# Patient Record
Sex: Male | Born: 1950 | Race: White | Hispanic: No | Marital: Married | Smoking: Never smoker
Health system: Southern US, Community
[De-identification: ages and names within clinical notes are randomized; demographics above are authoritative.]

## PROBLEM LIST (undated history)

## (undated) DIAGNOSIS — I10 Essential (primary) hypertension: Secondary | ICD-10-CM

---

## 2016-01-04 DIAGNOSIS — C4442 Squamous cell carcinoma of skin of scalp and neck: Secondary | ICD-10-CM | POA: Diagnosis not present

## 2016-01-04 DIAGNOSIS — D235 Other benign neoplasm of skin of trunk: Secondary | ICD-10-CM | POA: Diagnosis not present

## 2016-01-04 DIAGNOSIS — L570 Actinic keratosis: Secondary | ICD-10-CM | POA: Diagnosis not present

## 2016-01-04 DIAGNOSIS — L821 Other seborrheic keratosis: Secondary | ICD-10-CM | POA: Diagnosis not present

## 2016-01-04 DIAGNOSIS — D1801 Hemangioma of skin and subcutaneous tissue: Secondary | ICD-10-CM | POA: Diagnosis not present

## 2016-01-04 DIAGNOSIS — D485 Neoplasm of uncertain behavior of skin: Secondary | ICD-10-CM | POA: Diagnosis not present

## 2016-01-04 DIAGNOSIS — L814 Other melanin hyperpigmentation: Secondary | ICD-10-CM | POA: Diagnosis not present

## 2016-01-06 DIAGNOSIS — D044 Carcinoma in situ of skin of scalp and neck: Secondary | ICD-10-CM | POA: Diagnosis not present

## 2016-02-28 DIAGNOSIS — Z85828 Personal history of other malignant neoplasm of skin: Secondary | ICD-10-CM | POA: Diagnosis not present

## 2016-03-08 DIAGNOSIS — Z125 Encounter for screening for malignant neoplasm of prostate: Secondary | ICD-10-CM | POA: Diagnosis not present

## 2016-03-08 DIAGNOSIS — I10 Essential (primary) hypertension: Secondary | ICD-10-CM | POA: Diagnosis not present

## 2016-03-08 DIAGNOSIS — Z23 Encounter for immunization: Secondary | ICD-10-CM | POA: Diagnosis not present

## 2016-03-08 DIAGNOSIS — Z79899 Other long term (current) drug therapy: Secondary | ICD-10-CM | POA: Diagnosis not present

## 2016-03-08 DIAGNOSIS — M109 Gout, unspecified: Secondary | ICD-10-CM | POA: Diagnosis not present

## 2016-03-08 DIAGNOSIS — R351 Nocturia: Secondary | ICD-10-CM | POA: Diagnosis not present

## 2016-03-08 DIAGNOSIS — Z0001 Encounter for general adult medical examination with abnormal findings: Secondary | ICD-10-CM | POA: Diagnosis not present

## 2016-04-06 ENCOUNTER — Other Ambulatory Visit: Payer: Self-pay | Admitting: Gastroenterology

## 2016-05-25 ENCOUNTER — Encounter (HOSPITAL_COMMUNITY): Payer: Self-pay

## 2016-05-29 DIAGNOSIS — Z85828 Personal history of other malignant neoplasm of skin: Secondary | ICD-10-CM | POA: Diagnosis not present

## 2016-05-29 DIAGNOSIS — L905 Scar conditions and fibrosis of skin: Secondary | ICD-10-CM | POA: Diagnosis not present

## 2016-05-30 ENCOUNTER — Ambulatory Visit (HOSPITAL_COMMUNITY)
Admission: RE | Admit: 2016-05-30 | Discharge: 2016-05-30 | Disposition: A | Discharge status: Home / Self Care | Payer: Medicare HMO | Source: Ambulatory Visit | Attending: Gastroenterology | Admitting: Gastroenterology

## 2016-05-30 ENCOUNTER — Encounter (HOSPITAL_COMMUNITY)
Admission: RE | Disposition: A | Discharge status: Home / Self Care | Payer: Self-pay | Source: Ambulatory Visit | Attending: Gastroenterology

## 2016-05-30 ENCOUNTER — Ambulatory Visit (HOSPITAL_COMMUNITY): Payer: Medicare HMO | Admitting: Anesthesiology

## 2016-05-30 ENCOUNTER — Encounter (HOSPITAL_COMMUNITY): Payer: Self-pay | Admitting: *Deleted

## 2016-05-30 DIAGNOSIS — Z79899 Other long term (current) drug therapy: Secondary | ICD-10-CM | POA: Insufficient documentation

## 2016-05-30 DIAGNOSIS — K5730 Diverticulosis of large intestine without perforation or abscess without bleeding: Secondary | ICD-10-CM | POA: Diagnosis not present

## 2016-05-30 DIAGNOSIS — I10 Essential (primary) hypertension: Secondary | ICD-10-CM | POA: Insufficient documentation

## 2016-05-30 DIAGNOSIS — Z1211 Encounter for screening for malignant neoplasm of colon: Secondary | ICD-10-CM | POA: Insufficient documentation

## 2016-05-30 HISTORY — PX: COLONOSCOPY WITH PROPOFOL: SHX5780

## 2016-05-30 HISTORY — DX: Essential (primary) hypertension: I10

## 2016-05-30 SURGERY — COLONOSCOPY WITH PROPOFOL
Anesthesia: Monitor Anesthesia Care

## 2016-05-30 MED ORDER — PROPOFOL 10 MG/ML IV BOLUS
INTRAVENOUS | Status: AC
  Filled 2016-05-30: qty 20

## 2016-05-30 MED ORDER — LIDOCAINE 2% (20 MG/ML) 5 ML SYRINGE
INTRAMUSCULAR | Status: DC | PRN
  Administered 2016-05-30: 100 mg via INTRAVENOUS

## 2016-05-30 MED ORDER — SODIUM CHLORIDE 0.9 % IV SOLN: INTRAVENOUS | Status: DC

## 2016-05-30 MED ORDER — LACTATED RINGERS IV SOLN
INTRAVENOUS | Status: DC
  Administered 2016-05-30: 1000 mL via INTRAVENOUS

## 2016-05-30 MED ORDER — PROPOFOL 10 MG/ML IV BOLUS
INTRAVENOUS | Status: DC | PRN
  Administered 2016-05-30: 40 mg via INTRAVENOUS
  Administered 2016-05-30: 20 mg via INTRAVENOUS

## 2016-05-30 MED ORDER — LIDOCAINE 2% (20 MG/ML) 5 ML SYRINGE
INTRAMUSCULAR | Status: AC
  Filled 2016-05-30: qty 5

## 2016-05-30 MED ORDER — PROPOFOL 500 MG/50ML IV EMUL
INTRAVENOUS | Status: DC | PRN
  Administered 2016-05-30: 130 ug/kg/min via INTRAVENOUS

## 2016-05-30 MED ORDER — PROPOFOL 10 MG/ML IV BOLUS
INTRAVENOUS | Status: AC
Start: 2016-05-30 — End: 2016-05-30
  Filled 2016-05-30: qty 40

## 2016-05-30 SURGICAL SUPPLY — 21 items

## 2016-05-30 NOTE — Anesthesia Preprocedure Evaluation (Addendum)
Anesthesia Evaluation  Patient identified by MRN, date of birth, ID band Patient awake    Reviewed: Allergy & Precautions, H&P , NPO status , Patient's Chart, lab work & pertinent test results  Airway Mallampati: II   Neck ROM: full    Dental   Pulmonary neg pulmonary ROS,    breath sounds clear to auscultation       Cardiovascular hypertension,  Rhythm:regular Rate:Normal     Neuro/Psych    GI/Hepatic   Endo/Other    Renal/GU      Musculoskeletal   Abdominal   Peds  Hematology   Anesthesia Other Findings   Reproductive/Obstetrics                             Anesthesia Physical Anesthesia Plan  ASA: II  Anesthesia Plan: MAC   Post-op Pain Management:    Induction: Intravenous  Airway Management Planned: Simple Face Mask  Additional Equipment:   Intra-op Plan:   Post-operative Plan:   Informed Consent: I have reviewed the patients History and Physical, chart, labs and discussed the procedure including the risks, benefits and alternatives for the proposed anesthesia with the patient or authorized representative who has indicated his/her understanding and acceptance.     Plan Discussed with: CRNA, Anesthesiologist and Surgeon  Anesthesia Plan Comments:         Anesthesia Quick Evaluation

## 2016-05-30 NOTE — Transfer of Care (Signed)
Immediate Anesthesia Transfer of Care Note  Patient: Daniel Miranda  Procedure(s) Performed: Procedure(s): COLONOSCOPY WITH PROPOFOL (N/A)  Patient Location: PACU and Endoscopy Unit  Anesthesia Type:MAC  Level of Consciousness: awake, alert , oriented and patient cooperative  Airway & Oxygen Therapy: Patient Spontanous Breathing and Patient connected to face mask oxygen  Post-op Assessment: Report given to RN, Post -op Vital signs reviewed and stable and Patient moving all extremities  Post vital signs: Reviewed and stable  Last Vitals:  Vitals:   05/30/16 1031  BP: (!) 155/94  Pulse: 70  Resp: (!) 22  Temp: 37.2 C    Last Pain:  Vitals:   05/30/16 1031  TempSrc: Oral         Complications: No apparent anesthesia complications

## 2016-05-30 NOTE — Discharge Instructions (Signed)

## 2016-05-30 NOTE — H&P (Signed)
Procedure: Screening colonoscopy  History: The patient is a 66 year old male born November 29, 1950. He is scheduled to undergo a screening colonoscopy today.  Past medical history: Hypertension. Gout. Rheumatic heart disease. Tonsillectomy.  Exam: The patient is alert and lying comfortably on the endoscopy stretcher. Abdomen is soft and nontender to palpation. Lungs are clear to auscultation. Cardiac exam reveals a regular rhythm.  Plan: Proceed with screening colonoscopy

## 2016-05-30 NOTE — Anesthesia Postprocedure Evaluation (Signed)
Anesthesia Post Note  Patient: Daniel Miranda  Procedure(s) Performed: Procedure(s) (LRB): COLONOSCOPY WITH PROPOFOL (N/A)  Patient location during evaluation: PACU Anesthesia Type: MAC Level of consciousness: awake and alert Pain management: pain level controlled Vital Signs Assessment: post-procedure vital signs reviewed and stable Respiratory status: spontaneous breathing, nonlabored ventilation, respiratory function stable and patient connected to nasal cannula oxygen Cardiovascular status: stable and blood pressure returned to baseline Anesthetic complications: no       Last Vitals:  Vitals:   05/30/16 1210 05/30/16 1215  BP: 118/85 (!) 143/79  Pulse: 64 73  Resp: 15 15  Temp:      Last Pain:  Vitals:   05/30/16 1200  TempSrc: Oral                 Ridgely Anastacio S

## 2016-05-30 NOTE — Op Note (Signed)
[facility] Patient Name: Daniel Miranda Procedure Date: 05/30/2016 MRN: [id number] Attending MD: Garlan Fair , MD Date of Birth: 05-May-1951 CSN: [id number] Age: 66 Admit Type: Outpatient Procedure:                Colonoscopy Indications:              Screening for colorectal malignant neoplasm Providers:                Garlan Fair, MD, Laverta Baltimore RN, RN,                            William Dalton, Technician Referring MD:              Medicines:                Propofol per Anesthesia Complications:            No immediate complications. Estimated Blood Loss:     Estimated blood loss: none. Procedure:                Pre-Anesthesia Assessment:                           - Prior to the procedure, a History and Physical                            was performed, and patient medications and                            allergies were reviewed. The patient's tolerance of                            previous anesthesia was also reviewed. The risks                            and benefits of the procedure and the sedation                            options and risks were discussed with the patient.                            All questions were answered, and informed consent                            was obtained. Prior Anticoagulants: The patient has                            taken no previous anticoagulant or antiplatelet                            agents. ASA Grade Assessment: II - A patient with                            mild systemic disease. After reviewing the risks  and benefits, the patient was deemed in                            satisfactory condition to undergo the procedure.                           After obtaining informed consent, the colonoscope                            was passed under direct vision. Throughout the                            procedure, the patient's blood pressure, pulse, and   oxygen saturations were monitored continuously. The                            [id number] scope was introduced through                            the anus and advanced to the the cecum, identified                            by appendiceal orifice and ileocecal valve. The                            colonoscopy was performed without difficulty. The                            patient tolerated the procedure well. The quality                            of the bowel preparation was good. The appendiceal                            orifice and the rectum were photographed. Scope In: 11:35:01 AM Scope Out: 11:53:03 AM Scope Withdrawal Time: 0 hours 5 minutes 42 seconds  Total Procedure Duration: 0 hours 18 minutes 2 seconds  Findings:      The perianal and digital rectal examinations were normal.      The entire examined colon appeared normal. Impression:               - The entire examined colon is normal.                           - No specimens collected. Moderate Sedation:      N/A- Per Anesthesia Care Recommendation:           - Patient has a contact number available for                            emergencies. The signs and symptoms of potential                            delayed complications were discussed with the  patient. Return to normal activities tomorrow.                            Written discharge instructions were provided to the                            patient.                           - Repeat colonoscopy in 10 years for screening                            purposes.                           - Resume previous diet.                           - Continue present medications. Procedure Code(s):        --- Professional ---                           [id number], Colorectal cancer screening; colonoscopy on                            individual not meeting criteria for high risk Diagnosis Code(s):        --- Professional ---                            Z12.11, Encounter for screening for malignant                            neoplasm of colon CPT copyright 2016 [employer]. All rights reserved. The codes documented in this report are preliminary and upon coder review may  be revised to meet current compliance requirements. Earle Gell, MD Garlan Fair, MD 05/30/2016 11:58:11 AM This report has been signed electronically. Number of Addenda: 0

## 2016-06-02 ENCOUNTER — Encounter (HOSPITAL_COMMUNITY): Payer: Self-pay | Admitting: Gastroenterology

## 2016-06-21 DIAGNOSIS — M109 Gout, unspecified: Secondary | ICD-10-CM | POA: Diagnosis not present

## 2016-06-21 DIAGNOSIS — M79671 Pain in right foot: Secondary | ICD-10-CM | POA: Diagnosis not present

## 2016-06-21 DIAGNOSIS — M79674 Pain in right toe(s): Secondary | ICD-10-CM | POA: Diagnosis not present

## 2016-08-15 DIAGNOSIS — M109 Gout, unspecified: Secondary | ICD-10-CM | POA: Diagnosis not present

## 2017-02-04 DIAGNOSIS — I10 Essential (primary) hypertension: Secondary | ICD-10-CM | POA: Diagnosis not present

## 2017-02-04 DIAGNOSIS — X58XXXA Exposure to other specified factors, initial encounter: Secondary | ICD-10-CM | POA: Diagnosis not present

## 2017-02-04 DIAGNOSIS — S39012A Strain of muscle, fascia and tendon of lower back, initial encounter: Secondary | ICD-10-CM | POA: Diagnosis not present

## 2017-02-07 ENCOUNTER — Ambulatory Visit
Admission: RE | Admit: 2017-02-07 | Discharge: 2017-02-07 | Disposition: A | Discharge status: Home / Self Care | Payer: Medicare HMO | Source: Ambulatory Visit | Attending: Internal Medicine | Admitting: Internal Medicine

## 2017-02-07 ENCOUNTER — Other Ambulatory Visit: Payer: Self-pay | Admitting: Internal Medicine

## 2017-02-07 DIAGNOSIS — M5489 Other dorsalgia: Secondary | ICD-10-CM

## 2017-02-07 DIAGNOSIS — M545 Low back pain: Secondary | ICD-10-CM | POA: Diagnosis not present

## 2017-02-07 DIAGNOSIS — M48061 Spinal stenosis, lumbar region without neurogenic claudication: Secondary | ICD-10-CM | POA: Diagnosis not present

## 2017-03-13 DIAGNOSIS — I10 Essential (primary) hypertension: Secondary | ICD-10-CM | POA: Diagnosis not present

## 2017-03-13 DIAGNOSIS — Z125 Encounter for screening for malignant neoplasm of prostate: Secondary | ICD-10-CM | POA: Diagnosis not present

## 2017-03-13 DIAGNOSIS — Z0000 Encounter for general adult medical examination without abnormal findings: Secondary | ICD-10-CM | POA: Diagnosis not present

## 2017-03-13 DIAGNOSIS — M109 Gout, unspecified: Secondary | ICD-10-CM | POA: Diagnosis not present

## 2017-03-13 DIAGNOSIS — M5137 Other intervertebral disc degeneration, lumbosacral region: Secondary | ICD-10-CM | POA: Diagnosis not present

## 2017-03-13 DIAGNOSIS — Z1389 Encounter for screening for other disorder: Secondary | ICD-10-CM | POA: Diagnosis not present

## 2017-03-13 DIAGNOSIS — Z23 Encounter for immunization: Secondary | ICD-10-CM | POA: Diagnosis not present

## 2017-03-13 DIAGNOSIS — Z79899 Other long term (current) drug therapy: Secondary | ICD-10-CM | POA: Diagnosis not present

## 2017-05-24 DIAGNOSIS — R69 Illness, unspecified: Secondary | ICD-10-CM | POA: Diagnosis not present

## 2017-05-28 DIAGNOSIS — L814 Other melanin hyperpigmentation: Secondary | ICD-10-CM | POA: Diagnosis not present

## 2017-05-28 DIAGNOSIS — D229 Melanocytic nevi, unspecified: Secondary | ICD-10-CM | POA: Diagnosis not present

## 2017-05-28 DIAGNOSIS — L821 Other seborrheic keratosis: Secondary | ICD-10-CM | POA: Diagnosis not present

## 2017-05-28 DIAGNOSIS — D1801 Hemangioma of skin and subcutaneous tissue: Secondary | ICD-10-CM | POA: Diagnosis not present

## 2017-06-07 DIAGNOSIS — Z7189 Other specified counseling: Secondary | ICD-10-CM | POA: Diagnosis not present

## 2017-06-07 DIAGNOSIS — Z23 Encounter for immunization: Secondary | ICD-10-CM | POA: Diagnosis not present

## 2017-06-12 DIAGNOSIS — R69 Illness, unspecified: Secondary | ICD-10-CM | POA: Diagnosis not present

## 2017-08-08 DIAGNOSIS — R69 Illness, unspecified: Secondary | ICD-10-CM | POA: Diagnosis not present

## 2017-08-14 DIAGNOSIS — R69 Illness, unspecified: Secondary | ICD-10-CM | POA: Diagnosis not present

## 2017-11-07 DIAGNOSIS — R69 Illness, unspecified: Secondary | ICD-10-CM | POA: Diagnosis not present

## 2018-03-19 DIAGNOSIS — Z23 Encounter for immunization: Secondary | ICD-10-CM | POA: Diagnosis not present

## 2018-03-19 DIAGNOSIS — I10 Essential (primary) hypertension: Secondary | ICD-10-CM | POA: Diagnosis not present

## 2018-03-19 DIAGNOSIS — Z125 Encounter for screening for malignant neoplasm of prostate: Secondary | ICD-10-CM | POA: Diagnosis not present

## 2018-03-19 DIAGNOSIS — Z0000 Encounter for general adult medical examination without abnormal findings: Secondary | ICD-10-CM | POA: Diagnosis not present

## 2018-03-19 DIAGNOSIS — Z79899 Other long term (current) drug therapy: Secondary | ICD-10-CM | POA: Diagnosis not present

## 2018-03-19 DIAGNOSIS — Z1389 Encounter for screening for other disorder: Secondary | ICD-10-CM | POA: Diagnosis not present

## 2018-03-19 DIAGNOSIS — M109 Gout, unspecified: Secondary | ICD-10-CM | POA: Diagnosis not present

## 2018-03-19 DIAGNOSIS — M5137 Other intervertebral disc degeneration, lumbosacral region: Secondary | ICD-10-CM | POA: Diagnosis not present

## 2018-06-27 DIAGNOSIS — L814 Other melanin hyperpigmentation: Secondary | ICD-10-CM | POA: Diagnosis not present

## 2018-06-27 DIAGNOSIS — L570 Actinic keratosis: Secondary | ICD-10-CM | POA: Diagnosis not present

## 2018-06-27 DIAGNOSIS — D229 Melanocytic nevi, unspecified: Secondary | ICD-10-CM | POA: Diagnosis not present

## 2018-06-27 DIAGNOSIS — L819 Disorder of pigmentation, unspecified: Secondary | ICD-10-CM | POA: Diagnosis not present

## 2018-06-27 DIAGNOSIS — D1801 Hemangioma of skin and subcutaneous tissue: Secondary | ICD-10-CM | POA: Diagnosis not present

## 2018-06-27 DIAGNOSIS — L821 Other seborrheic keratosis: Secondary | ICD-10-CM | POA: Diagnosis not present

## 2018-07-10 DIAGNOSIS — Z0100 Encounter for examination of eyes and vision without abnormal findings: Secondary | ICD-10-CM | POA: Diagnosis not present

## 2018-07-10 DIAGNOSIS — H524 Presbyopia: Secondary | ICD-10-CM | POA: Diagnosis not present

## 2019-01-01 DIAGNOSIS — K648 Other hemorrhoids: Secondary | ICD-10-CM | POA: Diagnosis not present

## 2019-01-01 DIAGNOSIS — K625 Hemorrhage of anus and rectum: Secondary | ICD-10-CM | POA: Diagnosis not present

## 2019-01-30 DIAGNOSIS — D229 Melanocytic nevi, unspecified: Secondary | ICD-10-CM | POA: Diagnosis not present

## 2019-01-30 DIAGNOSIS — L814 Other melanin hyperpigmentation: Secondary | ICD-10-CM | POA: Diagnosis not present

## 2019-01-30 DIAGNOSIS — L570 Actinic keratosis: Secondary | ICD-10-CM | POA: Diagnosis not present

## 2019-01-30 DIAGNOSIS — L578 Other skin changes due to chronic exposure to nonionizing radiation: Secondary | ICD-10-CM | POA: Diagnosis not present

## 2019-01-30 DIAGNOSIS — L819 Disorder of pigmentation, unspecified: Secondary | ICD-10-CM | POA: Diagnosis not present

## 2019-01-30 DIAGNOSIS — L821 Other seborrheic keratosis: Secondary | ICD-10-CM | POA: Diagnosis not present

## 2019-02-19 DIAGNOSIS — R69 Illness, unspecified: Secondary | ICD-10-CM | POA: Diagnosis not present

## 2019-03-03 DIAGNOSIS — L578 Other skin changes due to chronic exposure to nonionizing radiation: Secondary | ICD-10-CM | POA: Diagnosis not present

## 2019-03-03 DIAGNOSIS — L570 Actinic keratosis: Secondary | ICD-10-CM | POA: Diagnosis not present

## 2019-04-07 DIAGNOSIS — Z0000 Encounter for general adult medical examination without abnormal findings: Secondary | ICD-10-CM | POA: Diagnosis not present

## 2019-04-07 DIAGNOSIS — Z1389 Encounter for screening for other disorder: Secondary | ICD-10-CM | POA: Diagnosis not present

## 2019-04-07 DIAGNOSIS — K648 Other hemorrhoids: Secondary | ICD-10-CM | POA: Diagnosis not present

## 2019-04-07 DIAGNOSIS — I10 Essential (primary) hypertension: Secondary | ICD-10-CM | POA: Diagnosis not present

## 2019-04-07 DIAGNOSIS — M5137 Other intervertebral disc degeneration, lumbosacral region: Secondary | ICD-10-CM | POA: Diagnosis not present

## 2019-04-07 DIAGNOSIS — K625 Hemorrhage of anus and rectum: Secondary | ICD-10-CM | POA: Diagnosis not present

## 2019-04-07 DIAGNOSIS — M109 Gout, unspecified: Secondary | ICD-10-CM | POA: Diagnosis not present

## 2019-04-07 DIAGNOSIS — Z79899 Other long term (current) drug therapy: Secondary | ICD-10-CM | POA: Diagnosis not present

## 2019-04-07 DIAGNOSIS — Z8249 Family history of ischemic heart disease and other diseases of the circulatory system: Secondary | ICD-10-CM | POA: Diagnosis not present

## 2019-04-07 DIAGNOSIS — Z125 Encounter for screening for malignant neoplasm of prostate: Secondary | ICD-10-CM | POA: Diagnosis not present

## 2019-05-27 DIAGNOSIS — K641 Second degree hemorrhoids: Secondary | ICD-10-CM | POA: Diagnosis not present

## 2019-06-16 ENCOUNTER — Ambulatory Visit: Payer: Medicare HMO | Attending: Internal Medicine

## 2019-06-16 DIAGNOSIS — Z23 Encounter for immunization: Secondary | ICD-10-CM

## 2019-06-16 NOTE — Progress Notes (Signed)
   Covid-19 Vaccination Clinic  Name:  Daniel Miranda    MRN: [id number] DOB: [birth year]  06/16/2019  [name] was observed post Covid-19 immunization for 15 minutes without incidence. He was provided with Vaccine Information Sheet and instruction to access the V-Safe system.   [name] was instructed to call 911 with any severe reactions post vaccine: Marland Kitchen Difficulty breathing  . Swelling of your face and throat  . A fast heartbeat  . A bad rash all over your body  . Dizziness and weakness    Immunizations Administered    Name Date Dose VIS Date Route   Pfizer COVID-19 Vaccine 06/16/2019  1:45 PM 0.3 mL 05/02/2019 Intramuscular   Manufacturer: [place]   Lot: [id number]   [place]: [id number]

## 2019-07-03 DIAGNOSIS — E785 Hyperlipidemia, unspecified: Secondary | ICD-10-CM | POA: Diagnosis not present

## 2019-07-03 DIAGNOSIS — L578 Other skin changes due to chronic exposure to nonionizing radiation: Secondary | ICD-10-CM | POA: Diagnosis not present

## 2019-07-03 DIAGNOSIS — L718 Other rosacea: Secondary | ICD-10-CM | POA: Diagnosis not present

## 2019-07-07 ENCOUNTER — Ambulatory Visit: Payer: Medicare HMO | Attending: Internal Medicine

## 2019-07-07 DIAGNOSIS — Z23 Encounter for immunization: Secondary | ICD-10-CM | POA: Insufficient documentation

## 2019-07-07 NOTE — Progress Notes (Signed)
   Covid-19 Vaccination Clinic  Name:  Daniel Miranda    MRN: [id number] DOB: [birth year]  07/07/2019  Daniel Miranda was observed post Covid-19 immunization for 15 minutes without incidence. He was provided with Vaccine Information Sheet and instruction to access the V-Safe system.   Daniel Miranda was instructed to call 911 with any severe reactions post vaccine: Marland Kitchen Difficulty breathing  . Swelling of your face and throat  . A fast heartbeat  . A bad rash all over your body  . Dizziness and weakness    Immunizations Administered    Name Date Dose VIS Date Route   Pfizer COVID-19 Vaccine 07/07/2019 11:54 AM 0.3 mL 05/02/2019 Intramuscular   Manufacturer: [place]   Lot: [id number]   [place]: [id number]

## 2019-07-12 IMAGING — CR DG LUMBAR SPINE 2-3V
3 series · 3 of 3 positions shown · non-contrast
Comparison: None.

CLINICAL DATA: 65-year-old male with a history of low back pain/
lumbar back pain

EXAM:
LUMBAR SPINE - 2-3 VIEW

[t l-spine a.p.]
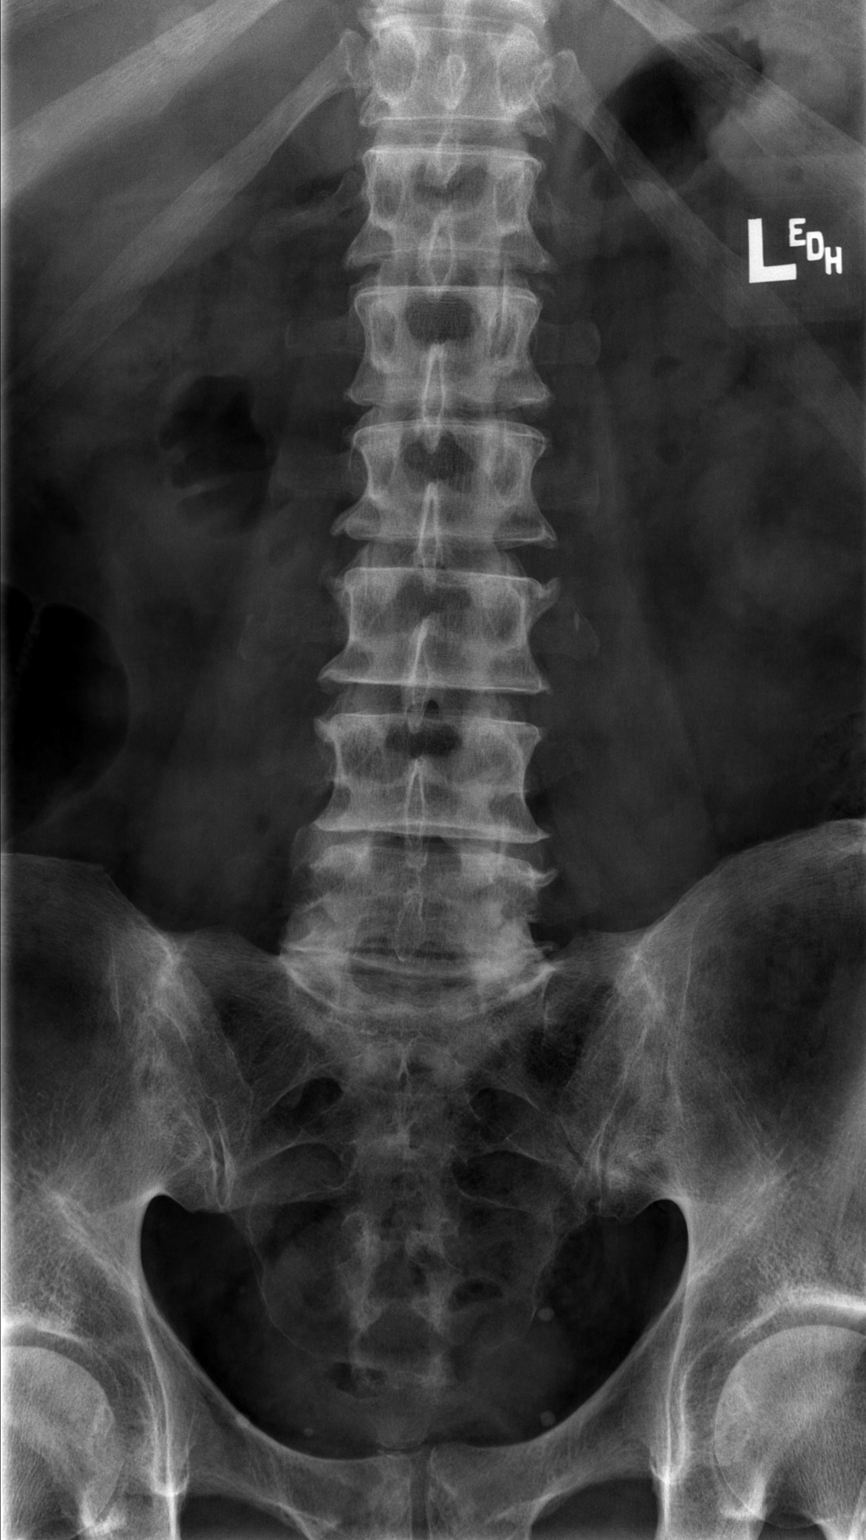

[t l-spine lat]
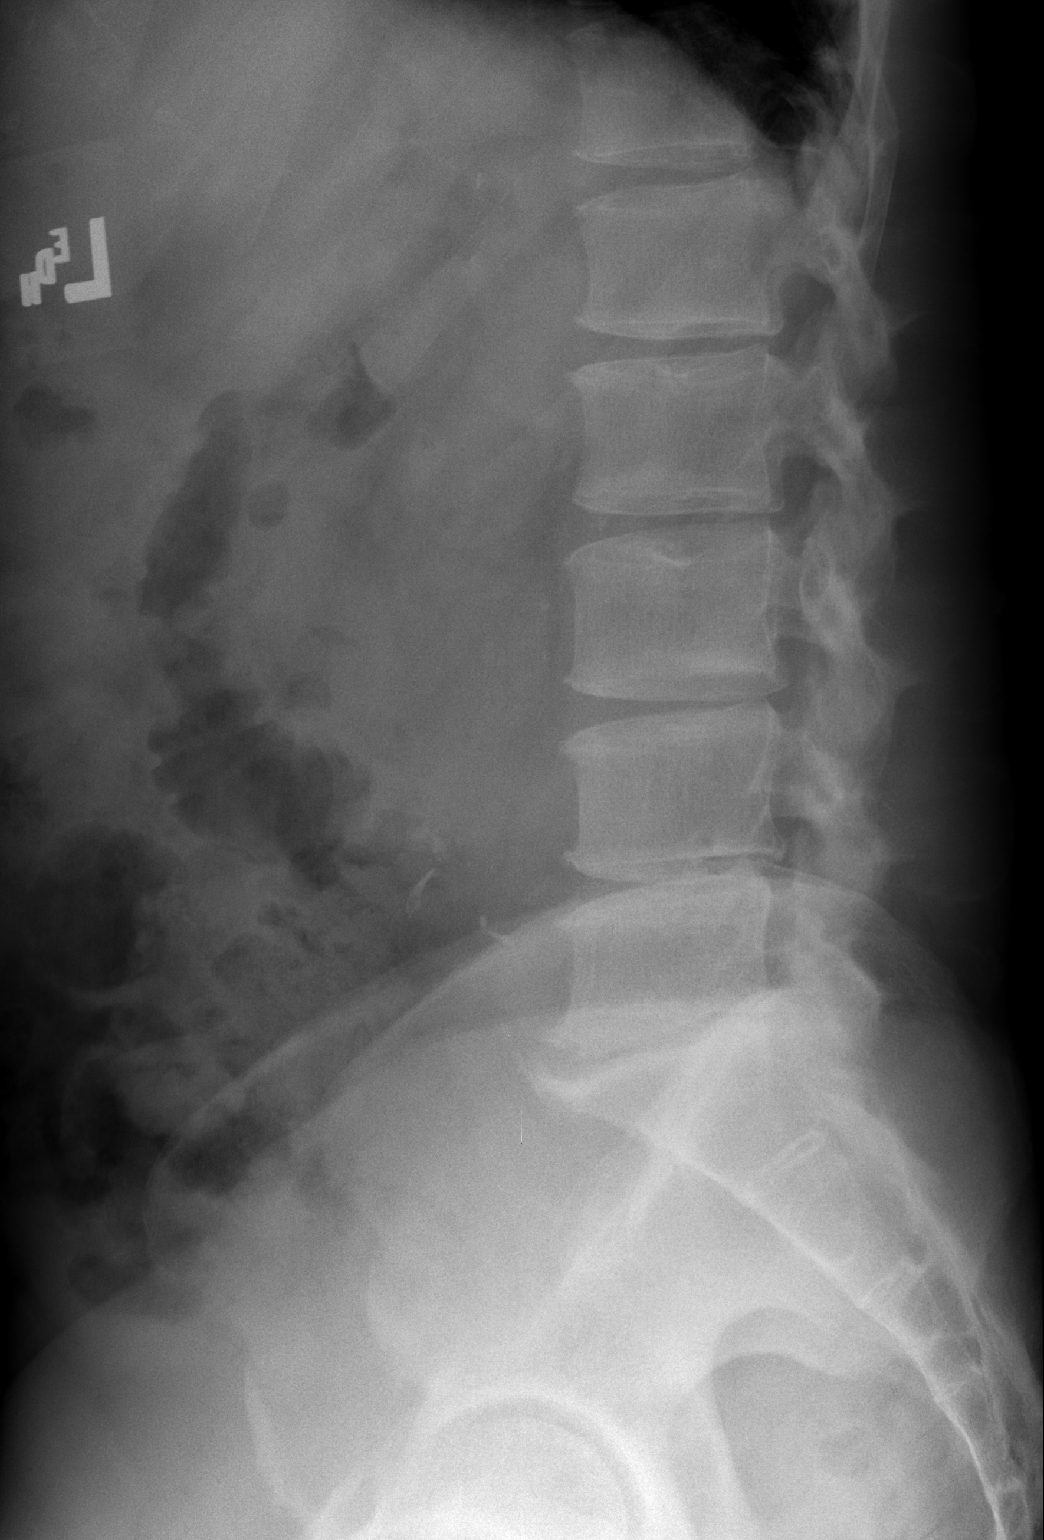

[t l-spine l5-s1 spot]
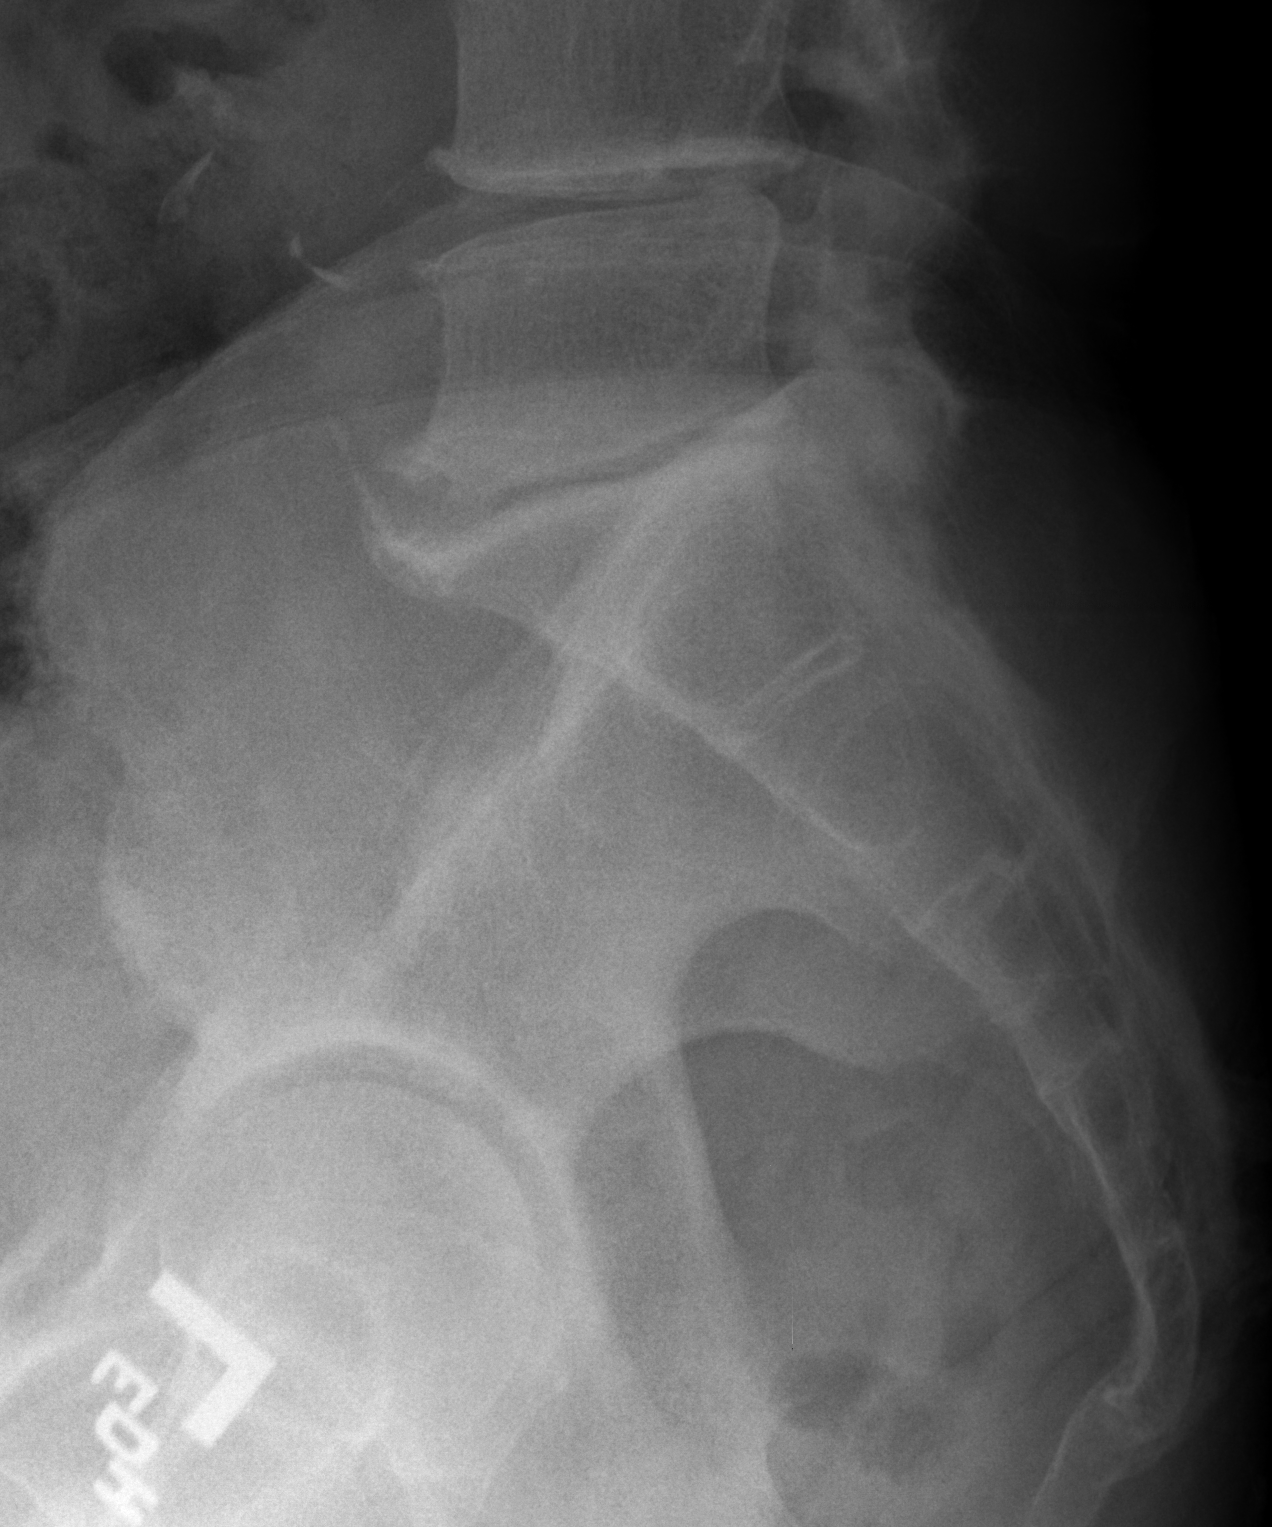

[3 of 3 positions shown; findings below may reference images not displayed]

FINDINGS: Lumbar Spine:

Note that there appear to be vestigial T12 ribs, with the current
numbering system using 5 lumbar vertebral bodies.

Lumbar vertebral elements maintain normal alignment without evidence
of subluxation. No anterolisthesis or retrolisthesis. Straightening
of the normal lumbar lordosis.

No fracture line identified. Lumbar vertebral body heights
maintained.

Advanced endplate sclerosis with disc space narrowing and anterior
osteophyte production at L5-S1.

Facet changes at L4-L5 and L5-S1.

Vascular calcifications
IMPRESSION: No acute fracture or malalignment of the lumbar spine.

Advanced degenerative disc disease at L5-S1.

Mild facet changes at L4-L5 and L5-S1.

## 2019-08-11 DIAGNOSIS — M62838 Other muscle spasm: Secondary | ICD-10-CM | POA: Diagnosis not present

## 2019-08-11 DIAGNOSIS — M26622 Arthralgia of left temporomandibular joint: Secondary | ICD-10-CM | POA: Diagnosis not present

## 2019-08-11 DIAGNOSIS — M26602 Left temporomandibular joint disorder, unspecified: Secondary | ICD-10-CM | POA: Diagnosis not present

## 2019-08-11 DIAGNOSIS — M6281 Muscle weakness (generalized): Secondary | ICD-10-CM | POA: Diagnosis not present

## 2019-08-20 DIAGNOSIS — M26622 Arthralgia of left temporomandibular joint: Secondary | ICD-10-CM | POA: Diagnosis not present

## 2019-08-20 DIAGNOSIS — M26602 Left temporomandibular joint disorder, unspecified: Secondary | ICD-10-CM | POA: Diagnosis not present

## 2019-08-20 DIAGNOSIS — M62838 Other muscle spasm: Secondary | ICD-10-CM | POA: Diagnosis not present

## 2019-08-20 DIAGNOSIS — M6281 Muscle weakness (generalized): Secondary | ICD-10-CM | POA: Diagnosis not present

## 2019-09-03 DIAGNOSIS — M26602 Left temporomandibular joint disorder, unspecified: Secondary | ICD-10-CM | POA: Diagnosis not present

## 2019-09-03 DIAGNOSIS — M6281 Muscle weakness (generalized): Secondary | ICD-10-CM | POA: Diagnosis not present

## 2019-09-03 DIAGNOSIS — M62838 Other muscle spasm: Secondary | ICD-10-CM | POA: Diagnosis not present

## 2019-09-03 DIAGNOSIS — M26622 Arthralgia of left temporomandibular joint: Secondary | ICD-10-CM | POA: Diagnosis not present

## 2019-09-18 DIAGNOSIS — M26602 Left temporomandibular joint disorder, unspecified: Secondary | ICD-10-CM | POA: Diagnosis not present

## 2019-09-18 DIAGNOSIS — M62838 Other muscle spasm: Secondary | ICD-10-CM | POA: Diagnosis not present

## 2019-09-18 DIAGNOSIS — M6281 Muscle weakness (generalized): Secondary | ICD-10-CM | POA: Diagnosis not present

## 2019-09-18 DIAGNOSIS — M26622 Arthralgia of left temporomandibular joint: Secondary | ICD-10-CM | POA: Diagnosis not present

## 2019-10-07 DIAGNOSIS — M26602 Left temporomandibular joint disorder, unspecified: Secondary | ICD-10-CM | POA: Diagnosis not present

## 2019-10-07 DIAGNOSIS — M62838 Other muscle spasm: Secondary | ICD-10-CM | POA: Diagnosis not present

## 2019-10-07 DIAGNOSIS — M6281 Muscle weakness (generalized): Secondary | ICD-10-CM | POA: Diagnosis not present

## 2019-10-07 DIAGNOSIS — M26622 Arthralgia of left temporomandibular joint: Secondary | ICD-10-CM | POA: Diagnosis not present

## 2019-11-05 DIAGNOSIS — M6281 Muscle weakness (generalized): Secondary | ICD-10-CM | POA: Diagnosis not present

## 2019-11-05 DIAGNOSIS — M26602 Left temporomandibular joint disorder, unspecified: Secondary | ICD-10-CM | POA: Diagnosis not present

## 2019-11-05 DIAGNOSIS — M26622 Arthralgia of left temporomandibular joint: Secondary | ICD-10-CM | POA: Diagnosis not present

## 2019-11-05 DIAGNOSIS — M62838 Other muscle spasm: Secondary | ICD-10-CM | POA: Diagnosis not present

## 2020-01-06 DIAGNOSIS — L821 Other seborrheic keratosis: Secondary | ICD-10-CM | POA: Diagnosis not present

## 2020-01-06 DIAGNOSIS — L578 Other skin changes due to chronic exposure to nonionizing radiation: Secondary | ICD-10-CM | POA: Diagnosis not present

## 2020-01-06 DIAGNOSIS — D225 Melanocytic nevi of trunk: Secondary | ICD-10-CM | POA: Diagnosis not present

## 2020-01-06 DIAGNOSIS — L718 Other rosacea: Secondary | ICD-10-CM | POA: Diagnosis not present

## 2020-02-13 DIAGNOSIS — R69 Illness, unspecified: Secondary | ICD-10-CM | POA: Diagnosis not present

## 2020-04-19 DIAGNOSIS — Z20822 Contact with and (suspected) exposure to covid-19: Secondary | ICD-10-CM | POA: Diagnosis not present

## 2020-05-03 DIAGNOSIS — Z125 Encounter for screening for malignant neoplasm of prostate: Secondary | ICD-10-CM | POA: Diagnosis not present

## 2020-05-03 DIAGNOSIS — I10 Essential (primary) hypertension: Secondary | ICD-10-CM | POA: Diagnosis not present

## 2020-05-03 DIAGNOSIS — N401 Enlarged prostate with lower urinary tract symptoms: Secondary | ICD-10-CM | POA: Diagnosis not present

## 2020-05-03 DIAGNOSIS — M5137 Other intervertebral disc degeneration, lumbosacral region: Secondary | ICD-10-CM | POA: Diagnosis not present

## 2020-05-03 DIAGNOSIS — K625 Hemorrhage of anus and rectum: Secondary | ICD-10-CM | POA: Diagnosis not present

## 2020-05-03 DIAGNOSIS — E559 Vitamin D deficiency, unspecified: Secondary | ICD-10-CM | POA: Diagnosis not present

## 2020-05-03 DIAGNOSIS — M109 Gout, unspecified: Secondary | ICD-10-CM | POA: Diagnosis not present

## 2020-05-03 DIAGNOSIS — Z79899 Other long term (current) drug therapy: Secondary | ICD-10-CM | POA: Diagnosis not present

## 2020-05-03 DIAGNOSIS — Z8249 Family history of ischemic heart disease and other diseases of the circulatory system: Secondary | ICD-10-CM | POA: Diagnosis not present

## 2020-05-03 DIAGNOSIS — Z1389 Encounter for screening for other disorder: Secondary | ICD-10-CM | POA: Diagnosis not present

## 2020-05-03 DIAGNOSIS — Z23 Encounter for immunization: Secondary | ICD-10-CM | POA: Diagnosis not present

## 2020-05-03 DIAGNOSIS — K648 Other hemorrhoids: Secondary | ICD-10-CM | POA: Diagnosis not present

## 2020-05-03 DIAGNOSIS — Z0000 Encounter for general adult medical examination without abnormal findings: Secondary | ICD-10-CM | POA: Diagnosis not present

## 2020-07-01 DIAGNOSIS — H16143 Punctate keratitis, bilateral: Secondary | ICD-10-CM | POA: Diagnosis not present

## 2021-01-05 DIAGNOSIS — L905 Scar conditions and fibrosis of skin: Secondary | ICD-10-CM | POA: Diagnosis not present

## 2021-01-05 DIAGNOSIS — L819 Disorder of pigmentation, unspecified: Secondary | ICD-10-CM | POA: Diagnosis not present

## 2021-01-05 DIAGNOSIS — Z85828 Personal history of other malignant neoplasm of skin: Secondary | ICD-10-CM | POA: Diagnosis not present

## 2021-01-05 DIAGNOSIS — L821 Other seborrheic keratosis: Secondary | ICD-10-CM | POA: Diagnosis not present

## 2021-01-05 DIAGNOSIS — L814 Other melanin hyperpigmentation: Secondary | ICD-10-CM | POA: Diagnosis not present

## 2021-01-05 DIAGNOSIS — D1801 Hemangioma of skin and subcutaneous tissue: Secondary | ICD-10-CM | POA: Diagnosis not present

## 2021-03-01 DIAGNOSIS — Z0100 Encounter for examination of eyes and vision without abnormal findings: Secondary | ICD-10-CM | POA: Diagnosis not present

## 2021-03-01 DIAGNOSIS — H524 Presbyopia: Secondary | ICD-10-CM | POA: Diagnosis not present

## 2021-05-26 DIAGNOSIS — K648 Other hemorrhoids: Secondary | ICD-10-CM | POA: Diagnosis not present

## 2021-05-26 DIAGNOSIS — K625 Hemorrhage of anus and rectum: Secondary | ICD-10-CM | POA: Diagnosis not present

## 2021-05-26 DIAGNOSIS — Z79899 Other long term (current) drug therapy: Secondary | ICD-10-CM | POA: Diagnosis not present

## 2021-05-26 DIAGNOSIS — Z0000 Encounter for general adult medical examination without abnormal findings: Secondary | ICD-10-CM | POA: Diagnosis not present

## 2021-05-26 DIAGNOSIS — Z7189 Other specified counseling: Secondary | ICD-10-CM | POA: Diagnosis not present

## 2021-05-26 DIAGNOSIS — I10 Essential (primary) hypertension: Secondary | ICD-10-CM | POA: Diagnosis not present

## 2021-05-26 DIAGNOSIS — Z1389 Encounter for screening for other disorder: Secondary | ICD-10-CM | POA: Diagnosis not present

## 2021-05-26 DIAGNOSIS — M5137 Other intervertebral disc degeneration, lumbosacral region: Secondary | ICD-10-CM | POA: Diagnosis not present

## 2021-05-26 DIAGNOSIS — Z8249 Family history of ischemic heart disease and other diseases of the circulatory system: Secondary | ICD-10-CM | POA: Diagnosis not present

## 2021-05-26 DIAGNOSIS — M109 Gout, unspecified: Secondary | ICD-10-CM | POA: Diagnosis not present

## 2021-05-26 DIAGNOSIS — E559 Vitamin D deficiency, unspecified: Secondary | ICD-10-CM | POA: Diagnosis not present

## 2021-05-26 DIAGNOSIS — Z125 Encounter for screening for malignant neoplasm of prostate: Secondary | ICD-10-CM | POA: Diagnosis not present

## 2021-05-26 DIAGNOSIS — N401 Enlarged prostate with lower urinary tract symptoms: Secondary | ICD-10-CM | POA: Diagnosis not present

## 2021-06-09 DIAGNOSIS — R972 Elevated prostate specific antigen [PSA]: Secondary | ICD-10-CM | POA: Diagnosis not present

## 2021-06-09 DIAGNOSIS — R8290 Unspecified abnormal findings in urine: Secondary | ICD-10-CM | POA: Diagnosis not present

## 2021-06-24 DIAGNOSIS — R3912 Poor urinary stream: Secondary | ICD-10-CM | POA: Diagnosis not present

## 2021-06-24 DIAGNOSIS — R972 Elevated prostate specific antigen [PSA]: Secondary | ICD-10-CM | POA: Diagnosis not present

## 2021-06-24 DIAGNOSIS — N401 Enlarged prostate with lower urinary tract symptoms: Secondary | ICD-10-CM | POA: Diagnosis not present

## 2021-07-12 ENCOUNTER — Other Ambulatory Visit: Payer: Self-pay | Admitting: Urology

## 2021-07-12 DIAGNOSIS — R972 Elevated prostate specific antigen [PSA]: Secondary | ICD-10-CM

## 2021-07-13 DIAGNOSIS — R972 Elevated prostate specific antigen [PSA]: Secondary | ICD-10-CM | POA: Diagnosis not present

## 2021-07-25 ENCOUNTER — Ambulatory Visit
Admission: RE | Admit: 2021-07-25 | Discharge: 2021-07-25 | Disposition: A | Discharge status: Home / Self Care | Payer: Medicare HMO | Source: Ambulatory Visit | Attending: Urology | Admitting: Urology

## 2021-07-25 DIAGNOSIS — K5730 Diverticulosis of large intestine without perforation or abscess without bleeding: Secondary | ICD-10-CM | POA: Diagnosis not present

## 2021-07-25 DIAGNOSIS — N402 Nodular prostate without lower urinary tract symptoms: Secondary | ICD-10-CM | POA: Diagnosis not present

## 2021-07-25 DIAGNOSIS — R972 Elevated prostate specific antigen [PSA]: Secondary | ICD-10-CM

## 2021-07-25 DIAGNOSIS — R590 Localized enlarged lymph nodes: Secondary | ICD-10-CM | POA: Diagnosis not present

## 2021-07-25 MED ORDER — GADOBENATE DIMEGLUMINE 529 MG/ML IV SOLN
17.0000 mL | Freq: Once | INTRAVENOUS | Status: AC | PRN
  Administered 2021-07-25: 17 mL via INTRAVENOUS

## 2021-09-07 DIAGNOSIS — R972 Elevated prostate specific antigen [PSA]: Secondary | ICD-10-CM | POA: Diagnosis not present

## 2021-09-08 DIAGNOSIS — N3000 Acute cystitis without hematuria: Secondary | ICD-10-CM | POA: Diagnosis not present

## 2021-09-26 DIAGNOSIS — C61 Malignant neoplasm of prostate: Secondary | ICD-10-CM | POA: Diagnosis not present

## 2022-03-08 DIAGNOSIS — Z85828 Personal history of other malignant neoplasm of skin: Secondary | ICD-10-CM | POA: Diagnosis not present

## 2022-03-08 DIAGNOSIS — L814 Other melanin hyperpigmentation: Secondary | ICD-10-CM | POA: Diagnosis not present

## 2022-03-08 DIAGNOSIS — Z08 Encounter for follow-up examination after completed treatment for malignant neoplasm: Secondary | ICD-10-CM | POA: Diagnosis not present

## 2022-03-08 DIAGNOSIS — D225 Melanocytic nevi of trunk: Secondary | ICD-10-CM | POA: Diagnosis not present

## 2022-03-08 DIAGNOSIS — L821 Other seborrheic keratosis: Secondary | ICD-10-CM | POA: Diagnosis not present

## 2022-03-29 DIAGNOSIS — C61 Malignant neoplasm of prostate: Secondary | ICD-10-CM | POA: Diagnosis not present

## 2022-04-25 ENCOUNTER — Encounter (HOSPITAL_COMMUNITY): Payer: Self-pay | Admitting: Emergency Medicine

## 2022-04-25 ENCOUNTER — Emergency Department (HOSPITAL_COMMUNITY): Payer: Medicare HMO

## 2022-04-25 ENCOUNTER — Observation Stay (HOSPITAL_COMMUNITY)
Admission: EM | Admit: 2022-04-25 | Discharge: 2022-04-27 | Disposition: A | Discharge status: Home / Self Care | Payer: Medicare HMO | Attending: General Surgery | Admitting: General Surgery

## 2022-04-25 DIAGNOSIS — K859 Acute pancreatitis without necrosis or infection, unspecified: Secondary | ICD-10-CM | POA: Diagnosis not present

## 2022-04-25 DIAGNOSIS — I10 Essential (primary) hypertension: Secondary | ICD-10-CM | POA: Insufficient documentation

## 2022-04-25 DIAGNOSIS — K5730 Diverticulosis of large intestine without perforation or abscess without bleeding: Secondary | ICD-10-CM | POA: Diagnosis not present

## 2022-04-25 DIAGNOSIS — K3580 Unspecified acute appendicitis: Secondary | ICD-10-CM | POA: Diagnosis not present

## 2022-04-25 DIAGNOSIS — Z79899 Other long term (current) drug therapy: Secondary | ICD-10-CM | POA: Insufficient documentation

## 2022-04-25 DIAGNOSIS — K3533 Acute appendicitis with perforation and localized peritonitis, with abscess: Secondary | ICD-10-CM | POA: Diagnosis not present

## 2022-04-25 DIAGNOSIS — K3532 Acute appendicitis with perforation and localized peritonitis, without abscess: Secondary | ICD-10-CM | POA: Diagnosis present

## 2022-04-25 DIAGNOSIS — R1031 Right lower quadrant pain: Secondary | ICD-10-CM | POA: Diagnosis not present

## 2022-04-25 LAB — I-STAT CHEM 8, ED
BUN: 12 mg/dL (ref 8–23)
Calcium, Ion: 1.13 mmol/L — ABNORMAL LOW (ref 1.15–1.40)
Chloride: 99 mmol/L (ref 98–111)
Creatinine, Ser: 0.9 mg/dL (ref 0.61–1.24)
Glucose, Bld: 108 mg/dL — ABNORMAL HIGH (ref 70–99)
HCT: 48 % (ref 39.0–52.0)
Hemoglobin: 16.3 g/dL (ref 13.0–17.0)
Potassium: 3.9 mmol/L (ref 3.5–5.1)
Sodium: 136 mmol/L (ref 135–145)
TCO2: 24 mmol/L (ref 22–32)

## 2022-04-25 LAB — CBC WITH DIFFERENTIAL/PLATELET
Abs Immature Granulocytes: 0.1 10*3/uL — ABNORMAL HIGH (ref 0.00–0.07)
Basophils Absolute: 0.1 10*3/uL (ref 0.0–0.1)
Basophils Relative: 0 %
Eosinophils Absolute: 0 10*3/uL (ref 0.0–0.5)
Eosinophils Relative: 0 %
HCT: 46.9 % (ref 39.0–52.0)
Hemoglobin: 16.2 g/dL (ref 13.0–17.0)
Immature Granulocytes: 1 %
Lymphocytes Relative: 10 %
Lymphs Abs: 1.6 10*3/uL (ref 0.7–4.0)
MCH: 31.8 pg (ref 26.0–34.0)
MCHC: 34.5 g/dL (ref 30.0–36.0)
MCV: 92 fL (ref 80.0–100.0)
Monocytes Absolute: 1.3 10*3/uL — ABNORMAL HIGH (ref 0.1–1.0)
Monocytes Relative: 9 %
Neutro Abs: 12.7 10*3/uL — ABNORMAL HIGH (ref 1.7–7.7)
Neutrophils Relative %: 80 %
Platelets: 278 10*3/uL (ref 150–400)
RBC: 5.1 MIL/uL (ref 4.22–5.81)
RDW: 12.8 % (ref 11.5–15.5)
WBC: 15.8 10*3/uL — ABNORMAL HIGH (ref 4.0–10.5)
nRBC: 0 % (ref 0.0–0.2)

## 2022-04-25 LAB — COMPREHENSIVE METABOLIC PANEL
ALT: 24 U/L (ref 0–44)
AST: 18 U/L (ref 15–41)
Albumin: 3.9 g/dL (ref 3.5–5.0)
Alkaline Phosphatase: 59 U/L (ref 38–126)
Anion gap: 8 (ref 5–15)
BUN: 11 mg/dL (ref 8–23)
CO2: 25 mmol/L (ref 22–32)
Calcium: 9.2 mg/dL (ref 8.9–10.3)
Chloride: 103 mmol/L (ref 98–111)
Creatinine, Ser: 0.99 mg/dL (ref 0.61–1.24)
GFR, Estimated: >60 mL/min (ref >60)
Glucose, Bld: 109 mg/dL — ABNORMAL HIGH (ref 70–99)
Potassium: 3.9 mmol/L (ref 3.5–5.1)
Sodium: 136 mmol/L (ref 135–145)
Total Bilirubin: 1.8 mg/dL — ABNORMAL HIGH (ref 0.3–1.2)
Total Protein: 7.1 g/dL (ref 6.5–8.1)

## 2022-04-25 LAB — LIPASE, BLOOD: Lipase: 28 U/L (ref 11–51)

## 2022-04-25 MED ORDER — OXYCODONE HCL 5 MG PO TABS: 5.0000 mg | ORAL_TABLET | ORAL | Status: DC | PRN

## 2022-04-25 MED ORDER — METOPROLOL TARTRATE 5 MG/5ML IV SOLN: 5.0000 mg | Freq: Four times a day (QID) | INTRAVENOUS | Status: DC | PRN

## 2022-04-25 MED ORDER — DIPHENHYDRAMINE HCL 12.5 MG/5ML PO ELIX: 12.5000 mg | ORAL_SOLUTION | Freq: Four times a day (QID) | ORAL | Status: DC | PRN

## 2022-04-25 MED ORDER — ONDANSETRON 4 MG PO TBDP: 4.0000 mg | ORAL_TABLET | Freq: Four times a day (QID) | ORAL | Status: DC | PRN

## 2022-04-25 MED ORDER — METHOCARBAMOL 500 MG PO TABS: 500.0000 mg | ORAL_TABLET | Freq: Three times a day (TID) | ORAL | Status: DC | PRN

## 2022-04-25 MED ORDER — KETOROLAC TROMETHAMINE 15 MG/ML IJ SOLN: 15.0000 mg | Freq: Four times a day (QID) | INTRAMUSCULAR | Status: DC | PRN

## 2022-04-25 MED ORDER — DOCUSATE SODIUM 100 MG PO CAPS
100.0000 mg | ORAL_CAPSULE | Freq: Two times a day (BID) | ORAL | Status: DC
  Administered 2022-04-25 – 2022-04-26 (×2): 100 mg via ORAL
  Filled 2022-04-25 (×3): qty 1

## 2022-04-25 MED ORDER — METHOCARBAMOL 1000 MG/10ML IJ SOLN
500.0000 mg | Freq: Three times a day (TID) | INTRAVENOUS | Status: DC | PRN
  Filled 2022-04-25: qty 5

## 2022-04-25 MED ORDER — HYDROMORPHONE HCL 1 MG/ML IJ SOLN: 0.5000 mg | INTRAMUSCULAR | Status: DC | PRN

## 2022-04-25 MED ORDER — HYDRALAZINE HCL 20 MG/ML IJ SOLN: 10.0000 mg | INTRAMUSCULAR | Status: DC | PRN

## 2022-04-25 MED ORDER — IOHEXOL 350 MG/ML SOLN
58.0000 mL | Freq: Once | INTRAVENOUS | Status: AC | PRN
  Administered 2022-04-25: 58 mL via INTRAVENOUS

## 2022-04-25 MED ORDER — DIPHENHYDRAMINE HCL 50 MG/ML IJ SOLN: 12.5000 mg | Freq: Four times a day (QID) | INTRAMUSCULAR | Status: DC | PRN

## 2022-04-25 MED ORDER — BISACODYL 10 MG RE SUPP: 10.0000 mg | Freq: Every day | RECTAL | Status: DC | PRN

## 2022-04-25 MED ORDER — ACETAMINOPHEN 500 MG PO TABS
1000.0000 mg | ORAL_TABLET | Freq: Four times a day (QID) | ORAL | Status: DC
  Administered 2022-04-25 – 2022-04-27 (×5): 1000 mg via ORAL
  Filled 2022-04-25 (×5): qty 2

## 2022-04-25 MED ORDER — SIMETHICONE 80 MG PO CHEW: 40.0000 mg | CHEWABLE_TABLET | Freq: Four times a day (QID) | ORAL | Status: DC | PRN

## 2022-04-25 MED ORDER — PIPERACILLIN-TAZOBACTAM 3.375 G IVPB
3.3750 g | Freq: Three times a day (TID) | INTRAVENOUS | Status: DC
  Administered 2022-04-26 – 2022-04-27 (×4): 3.375 g via INTRAVENOUS
  Filled 2022-04-25 (×3): qty 50

## 2022-04-25 MED ORDER — ONDANSETRON HCL 4 MG/2ML IJ SOLN: 4.0000 mg | Freq: Four times a day (QID) | INTRAMUSCULAR | Status: DC | PRN

## 2022-04-25 MED ORDER — SODIUM CHLORIDE 0.9 % IV SOLN: INTRAVENOUS | Status: DC

## 2022-04-25 MED ORDER — TRAMADOL HCL 50 MG PO TABS: 50.0000 mg | ORAL_TABLET | Freq: Four times a day (QID) | ORAL | Status: DC | PRN

## 2022-04-25 MED ORDER — PIPERACILLIN-TAZOBACTAM 3.375 G IVPB 30 MIN
3.3750 g | Freq: Once | INTRAVENOUS | Status: AC
  Administered 2022-04-25: 3.375 g via INTRAVENOUS
  Filled 2022-04-25: qty 50

## 2022-04-25 MED ORDER — HEPARIN SODIUM (PORCINE) 5000 UNIT/ML IJ SOLN
5000.0000 [IU] | Freq: Three times a day (TID) | INTRAMUSCULAR | Status: DC
  Administered 2022-04-25 – 2022-04-27 (×4): 5000 [IU] via SUBCUTANEOUS
  Filled 2022-04-25 (×4): qty 1

## 2022-04-25 NOTE — ED Provider Triage Note (Signed)
Emergency Medicine Provider Triage Evaluation Note  Daniel Miranda , a 71 y.o. male  was evaluated in triage.  Pt complains of lower abdominal pain since Monday that has been gradually worsening and now more on the right lower quadrant.  He said he had some nausea and vomiting yesterday.  As well as some low-grade fevers.  He has a history of diverticulitis and PCP had started him on Augmentin for this.  Symptoms have not improved so he wants to rule out appendicitis.  Review of Systems  Positive:  Negative:   Physical Exam  BP (!) 133/97 (BP Location: Right Arm)   Pulse 79   Temp 98.5 F (36.9 C)   Resp 17   Ht '5\' 9"'$  (1.753 m)   Wt 77 kg   SpO2 94%   BMI 25.07 kg/m  Gen:   Awake, no distress   Resp:  Normal effort  MSK:   Moves extremities without difficulty  Other:  + RLQ and Suprapubic tenderness  Medical Decision Making  Medically screening exam initiated at 1:54 PM.  Appropriate orders placed.  [name] was informed that the remainder of the evaluation will be completed by another provider, this initial triage assessment does not replace that evaluation, and the importance of remaining in the ED until their evaluation is complete.     [name], PA-C 04/25/22 1355

## 2022-04-25 NOTE — H&P (Signed)
Surgical Evaluation Requesting provider: Margaretmary Eddy MD  Chief Complaint: Abdominal pain  HPI: 71 year old male with history of hypertension and diverticulitis who presents with abdominal pain which began Monday morning around 2 AM. This was initially vague, central and lower abdominal pain and at first the worst symptom was associated vomiting/dry heaves which is now improved.  Over the course of the day yesterday the pain localized to the right lower and mid abdomen and he noted low-grade fevers up to 100.5.  His wife states that he slept most of the day.  He contacted his primary care doctor who did start him on Augmentin given history of diverticulitis and initial presentation with lower mid abdominal pain, but with failure to improve and migration to the right lower quadrant patient presented for further evaluation.  Currently he denies significant pain or nausea.  No Known Allergies  Past Medical History:  Diagnosis Date   Hypertension     Past Surgical History:  Procedure Laterality Date   COLONOSCOPY WITH PROPOFOL N/A 05/30/2016   Procedure: COLONOSCOPY WITH PROPOFOL;  Surgeon: Garlan Fair, MD;  Location: [facility];  Service: Endoscopy;  Laterality: N/A;    History reviewed. No pertinent family history.  Social History   Socioeconomic History   Marital status: Married    Spouse name: Not on file   Number of children: Not on file   Years of education: Not on file   Highest education level: Not on file  Occupational History   Not on file  Tobacco Use   Smoking status: Never   Smokeless tobacco: Never  Substance and Sexual Activity   Alcohol use: Yes    Comment: 7 drinks per week   Drug use: No   Sexual activity: Yes  Other Topics Concern   Not on file  Social History Narrative   Not on file   Social Determinants of Health   Financial Resource Strain: Not on file  Food Insecurity: Not on file  Transportation Needs: Not on file  Physical Activity: Not  on file  Stress: Not on file  Social Connections: Not on file    No current facility-administered medications on file prior to encounter.   Current Outpatient Medications on File Prior to Encounter  Medication Sig Dispense Refill   amLODipine (NORVASC) 5 MG tablet Take 5 mg by mouth daily.     ibuprofen (ADVIL,MOTRIN) 200 MG tablet Take 400 mg by mouth daily as needed for headache or moderate pain.     valsartan (DIOVAN) 160 MG tablet Take 160 mg by mouth daily.      Review of Systems: a complete, 10pt review of systems was completed with pertinent positives and negatives as documented in the HPI  Physical Exam: Vitals:   04/25/22 2141 04/25/22 2237  BP: 122/73 129/77  Pulse: 73 66  Resp: 16 15  Temp: 99.1 F (37.3 C)   SpO2: 96% 95%   Gen: A&Ox3, no distress  Chest: respiratory effort is normal.  Cardiovascular: RRR, no pedal edema Gastrointestinal: soft, nondistended, mildly tender in the right lower quadrant and suprapubic area without guarding or peritoneal signs Muscoloskeletal: no clubbing or cyanosis of the fingers.  Strength is symmetrical throughout.   Neuro: cranial nerves grossly intact, GCS 15 Psych: appropriate mood and affect, normal insight/judgment intact  Skin: warm and dry      Latest Ref Rng & Units 04/25/2022    4:01 PM 04/25/2022   12:50 PM  CBC  WBC 4.0 - 10.5 K/uL  15.8  Hemoglobin 13.0 - 17.0 g/dL 16.3  16.2   Hematocrit 39.0 - 52.0 % 48.0  46.9   Platelets 150 - 400 K/uL  278        Latest Ref Rng & Units 04/25/2022    4:01 PM 04/25/2022   12:50 PM  CMP  Glucose 70 - 99 mg/dL 108  109   BUN 8 - 23 mg/dL 12  11   Creatinine 0.61 - 1.24 mg/dL 0.90  0.99   Sodium 135 - 145 mmol/L 136  136   Potassium 3.5 - 5.1 mmol/L 3.9  3.9   Chloride 98 - 111 mmol/L 99  103   CO2 22 - 32 mmol/L  25   Calcium 8.9 - 10.3 mg/dL  9.2   Total Protein 6.5 - 8.1 g/dL  7.1   Total Bilirubin 0.3 - 1.2 mg/dL  1.8   Alkaline Phos 38 - 126 U/L  59   AST 15 - 41  U/L  18   ALT 0 - 44 U/L  24     No results found for: "INR", "PROTIME"  Imaging: CT Abdomen Pelvis W Contrast  Result Date: 04/25/2022 CLINICAL DATA:  RLQ abdominal pain EXAM: CT ABDOMEN AND PELVIS WITH CONTRAST TECHNIQUE: Multidetector CT imaging of the abdomen and pelvis was performed using the standard protocol following bolus administration of intravenous contrast. RADIATION DOSE REDUCTION: This exam was performed according to the departmental dose-optimization program which includes automated exposure control, adjustment of the mA and/or kV according to patient size and/or use of iterative reconstruction technique. CONTRAST:  75m OMNIPAQUE IOHEXOL 350 MG/ML SOLN COMPARISON:  None Available. FINDINGS: Lower chest: No acute abnormality. Hepatobiliary: Subcentimeter hypodensity too small to characterize. No gallstones, gallbladder wall thickening, or pericholecystic fluid. No biliary dilatation. Pancreas: No focal lesion. Normal pancreatic contour. No surrounding inflammatory changes. No main pancreatic ductal dilatation. Spleen: Normal in size without focal abnormality. Adrenals/Urinary Tract: No adrenal nodule bilaterally. Bilateral kidneys enhance symmetrically. No hydronephrosis. No hydroureter. The urinary bladder is unremarkable. Stomach/Bowel: Stomach is within normal limits. No evidence of bowel wall thickening or dilatation. Diffuse colonic diverticulosis. The appendix is enlarged in caliber measuring up to 16 cm with associated appendiceal wall thickening and periappendiceal fat stranding. Likely 3 x 1 cm (6:44, 7:54) loculated peripherally enhancing fluid collection along the appendiceal tip. No appendicolith identified. Vascular/Lymphatic: Mixing artifact noted within the inferior vena cava. Slightly tortuous distal vena cava and aortic bifurcation as well as common iliac vessels. No abdominal aorta or iliac aneurysm. Mild to moderate atherosclerotic plaque of the aorta and its branches. No  abdominal, pelvic, or inguinal lymphadenopathy. Reproductive: The prostate is enlarged measuring up to 4.7 cm. Other: Trace simple free fluid within the right lower quadrant. No intraperitoneal free gas. No organized fluid collection. Musculoskeletal: No abdominal wall hernia or abnormality. No suspicious lytic or blastic osseous lesions. No acute displaced fracture. Multilevel degenerative changes of the spine. Mild retrolisthesis of L4 on L5 and L5 on S1. L4 vertebral body hemangioma. IMPRESSION: 1. Acute appendicitis with perforated tip not excluded. No appendicolith. Likely developing periappendiceal abscess measuring 3 x 1 cm. 2. Colonic diverticulosis with no acute diverticulitis. 3.  Aortic Atherosclerosis (ICD10-I70.0). Electronically Signed   By: MIven FinnM.D.   On: 04/25/2022 19:11     A/P: 71year old male, approaching 48 hours of symptoms secondary to acute appendicitis with possible perforation and developing periappendiceal abscess measuring 3 x 1 cm.  Leukocytosis 15.8, lab work otherwise unremarkable; vital signs are currently normal  and he has a fairly benign abdominal exam without peritonitis at this point. Will admit for IV antibiotics, fluid resuscitation, potential laparoscopic appendectomy tomorrow pending clinical course; will discuss with our acute care surgeon of the week.  I discussed the surgery with the patient and his wife including relevant anatomy, basic technique, and risks of bleeding, infection, pain, scarring, injury to intra-abdominal structures, conversion to open surgery or more extensive resection, risk of staple line leak or delayed abscess, failure to resolve symptoms, postoperative ileus, incisional hernia, as well as general risks of DVT/PE, pneumonia, stroke, heart attack, death.  I discussed that in his case with small adjacent abscess and indolent course the risk of open conversion and were extensive resection may be slightly higher.  Questions were welcomed  and answered to the patient's satisfaction.       Romana Juniper, MD [facility], [place]  See AMION to contact appropriate on-call provider

## 2022-04-25 NOTE — ED Provider Notes (Signed)
[facility] EMERGENCY DEPARTMENT Provider Note   CSN: [id number] Arrival date & time: 04/25/22  1250     History {Add pertinent medical, surgical, social history, OB history to HPI:1} Chief Complaint  Patient presents with   Abdominal Pain    Daniel Miranda is a 71 y.o. male.  71 year old male with a history of hypertension who presents to the emergency department via with right lower quadrant abdominal pain.  Patient states that started on Monday and has had nausea with dry heaves.  Says he has intermittently felt feverish.  Discussed with his primary doctor who started him on Augmentin since he has a history of diverticulitis but with worsening pain came into the emergency department for evaluation.  No prior abdominal surgeries.  Is not on anticoagulation.  Last ate at noon today.    Past Medical History:  Diagnosis Date   Hypertension       Home Medications Prior to Admission medications   Medication Sig Start Date End Date Taking? Authorizing Provider  amLODipine (NORVASC) 5 MG tablet Take 5 mg by mouth daily.    [provider]  ibuprofen (ADVIL,MOTRIN) 200 MG tablet Take 400 mg by mouth daily as needed for headache or moderate pain.    [provider]  valsartan (DIOVAN) 160 MG tablet Take 160 mg by mouth daily.    [provider]      Allergies    Patient has no known allergies.    Review of Systems   Review of Systems  Physical Exam Updated Vital Signs BP 122/73 (BP Location: Right Arm)   Pulse 73   Temp 99.1 F (37.3 C) (Oral)   Resp 16   Ht '5\' 9"'$  (1.753 m)   Wt 77 kg   SpO2 96%   BMI 25.07 kg/m  Physical Exam Vitals and nursing note reviewed.  Constitutional:      General: He is not in acute distress.    Appearance: He is well-developed.  HENT:     Head: Normocephalic and atraumatic.     Right Ear: External ear normal.     Left Ear: External ear normal.     Nose: Nose normal.  Eyes:     Extraocular  Movements: Extraocular movements intact.     Conjunctiva/sclera: Conjunctivae normal.     Pupils: Pupils are equal, round, and reactive to light.  Cardiovascular:     Rate and Rhythm: Normal rate and regular rhythm.  Pulmonary:     Effort: Pulmonary effort is normal. No respiratory distress.  Abdominal:     General: There is no distension.     Palpations: Abdomen is soft. There is no mass.     Tenderness: There is abdominal tenderness (RLQ). There is no guarding.  Musculoskeletal:     Cervical back: Normal range of motion and neck supple.  Skin:    General: Skin is warm and dry.  Neurological:     Mental Status: He is alert. Mental status is at baseline.  Psychiatric:        Mood and Affect: Mood normal.        Behavior: Behavior normal.     ED Results / Procedures / Treatments   Labs (all labs ordered are listed, but only abnormal results are displayed) Labs Reviewed  CBC WITH DIFFERENTIAL/PLATELET - Abnormal; Notable for the following components:      Result Value   WBC 15.8 (*)    Neutro Abs 12.7 (*)    Monocytes Absolute 1.3 (*)  Abs Immature Granulocytes 0.10 (*)    All other components within normal limits  COMPREHENSIVE METABOLIC PANEL - Abnormal; Notable for the following components:   Glucose, Bld 109 (*)    Total Bilirubin 1.8 (*)    All other components within normal limits  I-STAT CHEM 8, ED - Abnormal; Notable for the following components:   Glucose, Bld 108 (*)    Calcium, Ion 1.13 (*)    All other components within normal limits  LIPASE, BLOOD  URINALYSIS, ROUTINE W REFLEX MICROSCOPIC    EKG None  Radiology CT Abdomen Pelvis W Contrast  Result Date: 04/25/2022 CLINICAL DATA:  RLQ abdominal pain EXAM: CT ABDOMEN AND PELVIS WITH CONTRAST TECHNIQUE: Multidetector CT imaging of the abdomen and pelvis was performed using the standard protocol following bolus administration of intravenous contrast. RADIATION DOSE REDUCTION: This exam was performed  according to the departmental dose-optimization program which includes automated exposure control, adjustment of the mA and/or kV according to patient size and/or use of iterative reconstruction technique. CONTRAST:  62m OMNIPAQUE IOHEXOL 350 MG/ML SOLN COMPARISON:  None Available. FINDINGS: Lower chest: No acute abnormality. Hepatobiliary: Subcentimeter hypodensity too small to characterize. No gallstones, gallbladder wall thickening, or pericholecystic fluid. No biliary dilatation. Pancreas: No focal lesion. Normal pancreatic contour. No surrounding inflammatory changes. No main pancreatic ductal dilatation. Spleen: Normal in size without focal abnormality. Adrenals/Urinary Tract: No adrenal nodule bilaterally. Bilateral kidneys enhance symmetrically. No hydronephrosis. No hydroureter. The urinary bladder is unremarkable. Stomach/Bowel: Stomach is within normal limits. No evidence of bowel wall thickening or dilatation. Diffuse colonic diverticulosis. The appendix is enlarged in caliber measuring up to 16 cm with associated appendiceal wall thickening and periappendiceal fat stranding. Likely 3 x 1 cm (6:44, 7:54) loculated peripherally enhancing fluid collection along the appendiceal tip. No appendicolith identified. Vascular/Lymphatic: Mixing artifact noted within the inferior vena cava. Slightly tortuous distal vena cava and aortic bifurcation as well as common iliac vessels. No abdominal aorta or iliac aneurysm. Mild to moderate atherosclerotic plaque of the aorta and its branches. No abdominal, pelvic, or inguinal lymphadenopathy. Reproductive: The prostate is enlarged measuring up to 4.7 cm. Other: Trace simple free fluid within the right lower quadrant. No intraperitoneal free gas. No organized fluid collection. Musculoskeletal: No abdominal wall hernia or abnormality. No suspicious lytic or blastic osseous lesions. No acute displaced fracture. Multilevel degenerative changes of the spine. Mild  retrolisthesis of L4 on L5 and L5 on S1. L4 vertebral body hemangioma. IMPRESSION: 1. Acute appendicitis with perforated tip not excluded. No appendicolith. Likely developing periappendiceal abscess measuring 3 x 1 cm. 2. Colonic diverticulosis with no acute diverticulitis. 3.  Aortic Atherosclerosis (ICD10-I70.0). Electronically Signed   By: MIven FinnM.D.   On: 04/25/2022 19:11    Procedures Procedures  {Document cardiac monitor, telemetry assessment procedure when appropriate:1}  Medications Ordered in ED Medications  piperacillin-tazobactam (ZOSYN) IVPB 3.375 g (has no administration in time range)  iohexol (OMNIPAQUE) 350 MG/ML injection 58 mL (58 mLs Intravenous Contrast Given 04/25/22 1849)    ED Course/ Medical Decision Making/ A&P Clinical Course as of 04/25/22 2207  Tue Apr 25, 2022  2201 Dr CWindle Guard[RP]    Clinical Course User Index [RP] PFransico Meadow MD                           Medical Decision Making Risk Prescription drug management.   ***  {Document critical care time when appropriate:1} {Document review of labs and  clinical decision tools ie heart score, Chads2Vasc2 etc:1}  {Document your independent review of radiology images, and any outside records:1} {Document your discussion with family members, caretakers, and with consultants:1} {Document social determinants of health affecting pt's care:1} {Document your decision making why or why not admission, treatments were needed:1} Final Clinical Impression(s) / ED Diagnoses Final diagnoses:  None    Rx / DC Orders ED Discharge Orders     None

## 2022-04-25 NOTE — ED Triage Notes (Signed)
Pt endorses mid and right sided abd pain since Monday morning around 2 am. Reports dry heaving initially but that has improved. Low grade fevers at home. Hx of diverticulitis and PCP started on abx.

## 2022-04-26 ENCOUNTER — Observation Stay (HOSPITAL_BASED_OUTPATIENT_CLINIC_OR_DEPARTMENT_OTHER): Payer: Medicare HMO | Admitting: Anesthesiology

## 2022-04-26 ENCOUNTER — Other Ambulatory Visit: Payer: Self-pay

## 2022-04-26 ENCOUNTER — Observation Stay (HOSPITAL_COMMUNITY): Payer: Medicare HMO | Admitting: Anesthesiology

## 2022-04-26 ENCOUNTER — Encounter (HOSPITAL_COMMUNITY)
Admission: EM | Disposition: A | Discharge status: Home / Self Care | Payer: Self-pay | Source: Home / Self Care | Attending: Emergency Medicine

## 2022-04-26 DIAGNOSIS — K37 Unspecified appendicitis: Secondary | ICD-10-CM | POA: Diagnosis not present

## 2022-04-26 DIAGNOSIS — K3580 Unspecified acute appendicitis: Secondary | ICD-10-CM | POA: Diagnosis not present

## 2022-04-26 HISTORY — PX: LAPAROSCOPIC APPENDECTOMY: SHX408

## 2022-04-26 LAB — BASIC METABOLIC PANEL
Anion gap: 11 (ref 5–15)
BUN: 11 mg/dL (ref 8–23)
CO2: 21 mmol/L — ABNORMAL LOW (ref 22–32)
Calcium: 8.8 mg/dL — ABNORMAL LOW (ref 8.9–10.3)
Chloride: 103 mmol/L (ref 98–111)
Creatinine, Ser: 0.89 mg/dL (ref 0.61–1.24)
GFR, Estimated: >60 mL/min (ref >60)
Glucose, Bld: 103 mg/dL — ABNORMAL HIGH (ref 70–99)
Potassium: 3.7 mmol/L (ref 3.5–5.1)
Sodium: 135 mmol/L (ref 135–145)

## 2022-04-26 LAB — CBC
HCT: 44.5 % (ref 39.0–52.0)
Hemoglobin: 14.7 g/dL (ref 13.0–17.0)
MCH: 31.6 pg (ref 26.0–34.0)
MCHC: 33 g/dL (ref 30.0–36.0)
MCV: 95.7 fL (ref 80.0–100.0)
Platelets: 221 10*3/uL (ref 150–400)
RBC: 4.65 MIL/uL (ref 4.22–5.81)
RDW: 13.2 % (ref 11.5–15.5)
WBC: 14.1 10*3/uL — ABNORMAL HIGH (ref 4.0–10.5)
nRBC: 0 % (ref 0.0–0.2)

## 2022-04-26 LAB — MAGNESIUM: Magnesium: 2 mg/dL (ref 1.7–2.4)

## 2022-04-26 SURGERY — APPENDECTOMY, LAPAROSCOPIC
Anesthesia: General | Site: Abdomen

## 2022-04-26 MED ORDER — SODIUM CHLORIDE 0.9 % IR SOLN
Status: DC | PRN
  Administered 2022-04-26: 500 mL

## 2022-04-26 MED ORDER — CHLORHEXIDINE GLUCONATE 0.12 % MT SOLN: 15.0000 mL | Freq: Once | OROMUCOSAL | Status: AC

## 2022-04-26 MED ORDER — OXYCODONE HCL 5 MG PO TABS: 5.0000 mg | ORAL_TABLET | ORAL | Status: DC | PRN

## 2022-04-26 MED ORDER — ORAL CARE MOUTH RINSE: 15.0000 mL | Freq: Once | OROMUCOSAL | Status: AC

## 2022-04-26 MED ORDER — BUPIVACAINE-EPINEPHRINE (PF) 0.25% -1:200000 IJ SOLN
INTRAMUSCULAR | Status: AC
  Filled 2022-04-26: qty 30

## 2022-04-26 MED ORDER — FENTANYL CITRATE (PF) 250 MCG/5ML IJ SOLN
INTRAMUSCULAR | Status: DC | PRN
  Administered 2022-04-26: 100 ug via INTRAVENOUS
  Administered 2022-04-26: 150 ug via INTRAVENOUS

## 2022-04-26 MED ORDER — BUPIVACAINE-EPINEPHRINE 0.25% -1:200000 IJ SOLN
INTRAMUSCULAR | Status: DC | PRN
  Administered 2022-04-26: 20 mL

## 2022-04-26 MED ORDER — ONDANSETRON HCL 4 MG/2ML IJ SOLN: 4.0000 mg | Freq: Once | INTRAMUSCULAR | Status: DC | PRN

## 2022-04-26 MED ORDER — FENTANYL CITRATE (PF) 250 MCG/5ML IJ SOLN
INTRAMUSCULAR | Status: AC
  Filled 2022-04-26: qty 5

## 2022-04-26 MED ORDER — LIDOCAINE 2% (20 MG/ML) 5 ML SYRINGE
INTRAMUSCULAR | Status: DC | PRN
  Administered 2022-04-26: 30 mg via INTRAVENOUS

## 2022-04-26 MED ORDER — PROPOFOL 10 MG/ML IV BOLUS
INTRAVENOUS | Status: DC | PRN
  Administered 2022-04-26: 170 mg via INTRAVENOUS

## 2022-04-26 MED ORDER — MIDAZOLAM HCL 2 MG/2ML IJ SOLN
INTRAMUSCULAR | Status: AC
  Filled 2022-04-26: qty 2

## 2022-04-26 MED ORDER — DEXAMETHASONE SODIUM PHOSPHATE 10 MG/ML IJ SOLN
INTRAMUSCULAR | Status: DC | PRN
  Administered 2022-04-26: 10 mg via INTRAVENOUS

## 2022-04-26 MED ORDER — CHLORHEXIDINE GLUCONATE 0.12 % MT SOLN
OROMUCOSAL | Status: AC
  Administered 2022-04-26: 15 mL via OROMUCOSAL
  Filled 2022-04-26: qty 15

## 2022-04-26 MED ORDER — SUGAMMADEX SODIUM 200 MG/2ML IV SOLN
INTRAVENOUS | Status: DC | PRN
  Administered 2022-04-26: 300 mg via INTRAVENOUS

## 2022-04-26 MED ORDER — FENTANYL CITRATE (PF) 100 MCG/2ML IJ SOLN: 25.0000 ug | INTRAMUSCULAR | Status: DC | PRN

## 2022-04-26 MED ORDER — ROCURONIUM BROMIDE 10 MG/ML (PF) SYRINGE
PREFILLED_SYRINGE | INTRAVENOUS | Status: DC | PRN
  Administered 2022-04-26: 60 mg via INTRAVENOUS

## 2022-04-26 MED ORDER — MIDAZOLAM HCL 2 MG/2ML IJ SOLN
INTRAMUSCULAR | Status: DC | PRN
  Administered 2022-04-26: 2 mg via INTRAVENOUS

## 2022-04-26 MED ORDER — PROPOFOL 10 MG/ML IV BOLUS
INTRAVENOUS | Status: AC
  Filled 2022-04-26: qty 20

## 2022-04-26 MED ORDER — ONDANSETRON HCL 4 MG/2ML IJ SOLN
INTRAMUSCULAR | Status: DC | PRN
  Administered 2022-04-26: 4 mg via INTRAVENOUS

## 2022-04-26 MED ORDER — KETOROLAC TROMETHAMINE 30 MG/ML IJ SOLN
INTRAMUSCULAR | Status: DC | PRN
  Administered 2022-04-26: 30 mg via INTRAVENOUS

## 2022-04-26 MED ORDER — HYDROMORPHONE HCL 1 MG/ML IJ SOLN: 0.5000 mg | INTRAMUSCULAR | Status: DC | PRN

## 2022-04-26 MED ORDER — LACTATED RINGERS IV SOLN: INTRAVENOUS | Status: DC

## 2022-04-26 MED ORDER — 0.9 % SODIUM CHLORIDE (POUR BTL) OPTIME
TOPICAL | Status: DC | PRN
  Administered 2022-04-26: 1000 mL

## 2022-04-26 SURGICAL SUPPLY — 39 items
APPLIER CLIP ROT 10 11.4 M/L (STAPLE) ×1
BAG COUNTER SPONGE SURGICOUNT (BAG) ×1 IMPLANT
BLADE CLIPPER SURG (BLADE) IMPLANT
CANISTER SUCT 3000ML PPV (MISCELLANEOUS) ×1 IMPLANT
CHLORAPREP W/TINT 26 (MISCELLANEOUS) ×1 IMPLANT
CLIP APPLIE ROT 10 11.4 M/L (STAPLE) IMPLANT
COVER SURGICAL LIGHT HANDLE (MISCELLANEOUS) ×1 IMPLANT
CUTTER FLEX LINEAR 45M (STAPLE) ×1 IMPLANT
DERMABOND ADVANCED .7 DNX12 (GAUZE/BANDAGES/DRESSINGS) ×1 IMPLANT
ELECT REM PT RETURN 9FT ADLT (ELECTROSURGICAL) ×1
ELECTRODE REM PT RTRN 9FT ADLT (ELECTROSURGICAL) ×1 IMPLANT
ENDOLOOP SUT PDS II  0 18 (SUTURE)
ENDOLOOP SUT PDS II 0 18 (SUTURE) IMPLANT
GLOVE BIO SURGEON STRL SZ7.5 (GLOVE) ×1 IMPLANT
GOWN STRL REUS W/ TWL LRG LVL3 (GOWN DISPOSABLE) ×3 IMPLANT
GOWN STRL REUS W/TWL LRG LVL3 (GOWN DISPOSABLE) ×3
KIT BASIN OR (CUSTOM PROCEDURE TRAY) ×1 IMPLANT
KIT TURNOVER KIT B (KITS) ×1 IMPLANT
NS IRRIG 1000ML POUR BTL (IV SOLUTION) ×1 IMPLANT
PAD ARMBOARD 7.5X6 YLW CONV (MISCELLANEOUS) ×2 IMPLANT
POUCH SPECIMEN RETRIEVAL 10MM (ENDOMECHANICALS) ×1 IMPLANT
RELOAD 45 THICK GREEN (ENDOMECHANICALS) ×1 IMPLANT
RELOAD STAPLE 45 3.5 BLU ETS (ENDOMECHANICALS) IMPLANT
RELOAD STAPLE 45 GRN THCK ETS (ENDOMECHANICALS) IMPLANT
RELOAD STAPLE TA45 3.5 REG BLU (ENDOMECHANICALS) IMPLANT
SET IRRIG TUBING LAPAROSCOPIC (IRRIGATION / IRRIGATOR) ×1 IMPLANT
SET TUBE SMOKE EVAC HIGH FLOW (TUBING) ×1 IMPLANT
SHEARS HARMONIC ACE PLUS 36CM (ENDOMECHANICALS) ×1 IMPLANT
SLEEVE Z-THREAD 5X100MM (TROCAR) IMPLANT
SPECIMEN JAR SMALL (MISCELLANEOUS) ×1 IMPLANT
SUT MNCRL AB 4-0 PS2 18 (SUTURE) ×1 IMPLANT
TOWEL GREEN STERILE (TOWEL DISPOSABLE) ×1 IMPLANT
TOWEL GREEN STERILE FF (TOWEL DISPOSABLE) ×1 IMPLANT
TRAY FOLEY W/BAG SLVR 16FR (SET/KITS/TRAYS/PACK) ×1
TRAY FOLEY W/BAG SLVR 16FR ST (SET/KITS/TRAYS/PACK) ×1 IMPLANT
TRAY LAPAROSCOPIC MC (CUSTOM PROCEDURE TRAY) ×1 IMPLANT
TROCAR XCEL BLUNT TIP 100MML (ENDOMECHANICALS) ×1 IMPLANT
TROCAR Z-THREAD OPTICAL 5X100M (TROCAR) ×2 IMPLANT
WATER STERILE IRR 1000ML POUR (IV SOLUTION) ×1 IMPLANT

## 2022-04-26 NOTE — Anesthesia Procedure Notes (Signed)
Procedure Name: Intubation Date/Time: 04/26/2022 2:22 PM  Performed by: Eligha Bridegroom, CRNAPre-anesthesia Checklist: Patient identified, Emergency Drugs available, Suction available, Patient being monitored and Timeout performed Patient Re-evaluated:Patient Re-evaluated prior to induction Oxygen Delivery Method: Circle system utilized Preoxygenation: Pre-oxygenation with 100% oxygen Induction Type: IV induction Ventilation: Mask ventilation without difficulty Laryngoscope Size: Glidescope Grade View: Grade III Tube type: Oral Number of attempts: 1 Placement Confirmation: ETT inserted through vocal cords under direct vision, positive ETCO2 and breath sounds checked- equal and bilateral Secured at: 22 cm Tube secured with: Tape Dental Injury: Teeth and Oropharynx as per pre-operative assessment

## 2022-04-26 NOTE — Op Note (Signed)
04/26/2022  3:18 PM  PATIENT:  Daniel Miranda  71 y.o. male  PRE-OPERATIVE DIAGNOSIS:  APPENDICITIS  POST-OPERATIVE DIAGNOSIS:  APPENDICITIS  PROCEDURE:  Procedure(s): APPENDECTOMY LAPAROSCOPIC (N/A)  SURGEON:  Surgeon(s) and Role:    * Jovita Kussmaul, MD - Primary  PHYSICIAN ASSISTANT:   ASSISTANTS: Dr. Mariel Aloe   ANESTHESIA:   local and general  EBL:  minimal   BLOOD ADMINISTERED:none  DRAINS: none   LOCAL MEDICATIONS USED:  MARCAINE     SPECIMEN:  Source of Specimen:  appendix  DISPOSITION OF SPECIMEN:  PATHOLOGY  COUNTS:  YES  TOURNIQUET:  * No tourniquets in log *  DICTATION: .Dragon Dictation  After informed consent was obtained patient was brought to the operating room placed in the supine position on the operating room table. After adequate induction of general anesthesia the patient's abdomen was prepped with ChloraPrep, allowed to dry, and draped in usual sterile manner. The area below the umbilicus was infiltrated with quarter percent Marcaine. A small incision was made with a 15 blade knife. This incision was carried down through the subcutaneous tissue bluntly with a hemostat and [employer] retractors until the linea alba was identified. The linea alba was incised with a 15 blade knife. Each side was grasped Coker clamps and elevated anteriorly. The preperitoneal space was probed bluntly with a hemostat until the peritoneum was opened and access was gained to the abdominal cavity. A 0 Vicryl purse string stitch was placed in the fascia surrounding the opening. A Hassan cannula was placed through the opening and anchored in place with the previously placed Vicryl purse string stitch. The laparoscope was placed through the [facility] cannula. The abdomen was insufflated with carbon dioxide without difficulty. Next the suprapubic area was infiltrated with quarter percent Marcaine. A small incision was made with a 15 blade knife. A 5 mm port was placed bluntly through this  incision into the abdominal cavity. A site was then chosen between the 2 port for placement of a 5 mm port. The area was infiltrated with quarter percent Marcaine. A small stab incision was made with a 15 blade knife. A 5 mm port was placed bluntly through this incision and the abdominal cavity under direct vision. The laparoscope was then moved to the suprapubic port. Using a Glassman grasper and harmonic scalpel the right lower quadrant was inspected. The appendix was readily identified.  The appendix was very inflamed and stuck to the pelvic sidewall.  As we bluntly mobilized it up there was a abscess cavity posterior to it that was evacuated and drained.  The appendix was elevated anteriorly and the mesoappendix was taken down sharply with the harmonic scalpel. Once the base of the appendix where it joined the cecum was identified and cleared of any tissue then a laparoscopic GIA green load stapler was placed through the [facility] cannula. The stapler was placed across the base of the appendix clamped and fired thereby dividing the base of the appendix between staple lines. A laparoscopic bag was then inserted through the [facility] cannula. The appendix was placed within the bag and the bag was sealed. The abdomen was then irrigated with copious amounts of saline until the effluent was clear. No other abnormalities were noted. The appendix and bag were removed with the [facility] cannula through the infraumbilical port without difficulty. The fascial defect was closed with the previously placed Vicryl pursestring stitch as well as with another interrupted 0 Vicryl figure-of-eight stitch. The rest of the ports were removed under direct  vision and were found to be hemostatic. The gas was allowed to escape. The skin incisions were closed with interrupted 4-0 Monocryl subcuticular stitches. Dermabond dressings were applied. The patient tolerated the procedure well. At the end of the case all needle sponge and instrument  counts were correct. The patient was then awakened and taken to recovery in stable condition.  PLAN OF CARE: Admit for overnight observation  PATIENT DISPOSITION:  PACU - hemodynamically stable.   Delay start of Pharmacological VTE agent (>24hrs) due to surgical blood loss or risk of bleeding: not applicable

## 2022-04-26 NOTE — Anesthesia Preprocedure Evaluation (Signed)
Anesthesia Evaluation  Patient identified by MRN, date of birth, ID band Patient awake    Reviewed: Allergy & Precautions, NPO status , Patient's Chart, lab work & pertinent test results  Airway Mallampati: IV  TM Distance: >3 FB Neck ROM: Full    Dental  (+) Teeth Intact, Dental Advisory Given, Caps,    Pulmonary neg pulmonary ROS   Pulmonary exam normal breath sounds clear to auscultation       Cardiovascular hypertension, Pt. on medications Normal cardiovascular exam Rhythm:Regular Rate:Normal     Neuro/Psych negative neurological ROS  negative psych ROS   GI/Hepatic negative GI ROS, Neg liver ROS,,,  Endo/Other  negative endocrine ROS    Renal/GU negative Renal ROS     Musculoskeletal negative musculoskeletal ROS (+)    Abdominal   Peds  Hematology negative hematology ROS (+)   Anesthesia Other Findings Day of surgery medications reviewed with the patient.  Reproductive/Obstetrics                             Anesthesia Physical Anesthesia Plan  ASA: 2  Anesthesia Plan: General   Post-op Pain Management: Tylenol PO (pre-op)* and Toradol IV (intra-op)*   Induction: Intravenous  PONV Risk Score and Plan: 2 and Dexamethasone and Ondansetron  Airway Management Planned: Video Laryngoscope Planned and Oral ETT  Additional Equipment:   Intra-op Plan:   Post-operative Plan: Extubation in OR  Informed Consent: I have reviewed the patients History and Physical, chart, labs and discussed the procedure including the risks, benefits and alternatives for the proposed anesthesia with the patient or authorized representative who has indicated his/her understanding and acceptance.     Dental advisory given  Plan Discussed with: CRNA  Anesthesia Plan Comments:        Anesthesia Quick Evaluation

## 2022-04-26 NOTE — ED Notes (Signed)
Pt resting comfortably, no C/O pain, Needs nothing

## 2022-04-26 NOTE — Interval H&P Note (Signed)
History and Physical Interval Note:  04/26/2022 1:14 PM  Daniel Miranda  has presented today for surgery, with the diagnosis of APPENDICITIS.  The various methods of treatment have been discussed with the patient and family. After consideration of risks, benefits and other options for treatment, the patient has consented to  Procedure(s): APPENDECTOMY LAPAROSCOPIC (N/A) as a surgical intervention.  The patient's history has been reviewed, patient examined, no change in status, stable for surgery.  I have reviewed the patient's chart and labs.  Questions were answered to the patient's satisfaction.     [name]

## 2022-04-26 NOTE — Transfer of Care (Signed)
Immediate Anesthesia Transfer of Care Note  Patient: Daniel Miranda  Procedure(s) Performed: APPENDECTOMY LAPAROSCOPIC (Abdomen)  Patient Location: PACU  Anesthesia Type:General  Level of Consciousness: awake, alert , and oriented  Airway & Oxygen Therapy: Patient Spontanous Breathing  Post-op Assessment: Report given to RN and Post -op Vital signs reviewed and stable  Post vital signs: Reviewed and stable  Last Vitals:  Vitals Value Taken Time  BP 133/81 04/26/22 1530  Temp    Pulse 72 04/26/22 1531  Resp 14 04/26/22 1531  SpO2 93 % 04/26/22 1531  Vitals shown include unvalidated device data.  Last Pain:  Vitals:   04/26/22 1234  TempSrc: Oral  PainSc: 4          Complications: No notable events documented.

## 2022-04-26 NOTE — Discharge Instructions (Signed)
[facility]  Please arrive at least 30 min before your appointment to complete your check in paperwork.  If you are unable to arrive 30 min prior to your appointment time we may have to cancel or reschedule you. LAPAROSCOPIC SURGERY: POST OP INSTRUCTIONS Always review your discharge instruction sheet given to you by the facility where your surgery was performed. IF YOU HAVE DISABILITY OR FAMILY LEAVE FORMS, YOU MUST BRING THEM TO THE OFFICE FOR PROCESSING.   DO NOT GIVE THEM TO YOUR DOCTOR.  PAIN CONTROL  First take acetaminophen (Tylenol) AND/or ibuprofen (Advil) to control your pain after surgery.  Follow directions on package.  Taking acetaminophen (Tylenol) and/or ibuprofen (Advil) regularly after surgery will help to control your pain and lower the amount of prescription pain medication you may need.  You should not take more than 4,000 mg (4 grams) of acetaminophen (Tylenol) in 24 hours.  You should not take ibuprofen (Advil), aleve, motrin, naprosyn or other NSAIDS if you have a history of stomach ulcers or chronic kidney disease.  A prescription for pain medication may be given to you upon discharge.  Take your pain medication as prescribed, if you still have uncontrolled pain after taking acetaminophen (Tylenol) or ibuprofen (Advil). Use ice packs to help control pain. If you need a refill on your pain medication, please contact your pharmacy.  They will contact our office to request authorization. Prescriptions will not be filled after 5pm or on week-ends.  HOME MEDICATIONS Take your usually prescribed medications unless otherwise directed.  DIET You should follow a light diet the first few days after arrival home.  Be sure to include lots of fluids daily. Avoid fatty, fried foods.   CONSTIPATION It is common to experience some constipation after surgery and if you are taking pain medication.  Increasing fluid intake and taking a stool softener (such as Colace)  will usually help or prevent this problem from occurring.  A mild laxative (Milk of Magnesia or Miralax) should be taken according to package instructions if there are no bowel movements after 48 hours.  WOUND/INCISION CARE Most patients will experience some swelling and bruising in the area of the incisions.  Ice packs will help.  Swelling and bruising can take several days to resolve.  Unless discharge instructions indicate otherwise, follow guidelines below  STERI-STRIPS - you may remove your outer bandages 48 hours after surgery, and you may shower at that time.  You have steri-strips (small skin tapes) in place directly over the incision.  These strips should be left on the skin for 7-10 days.   DERMABOND/SKIN GLUE - you may shower in 24 hours.  The glue will flake off over the next 2-3 weeks. Any sutures or staples will be removed at the office during your follow-up visit.  ACTIVITIES You may resume regular (light) daily activities beginning the next day--such as daily self-care, walking, climbing stairs--gradually increasing activities as tolerated.  You may have sexual intercourse when it is comfortable.  Refrain from any heavy lifting or straining until approved by your doctor. You may drive when you are no longer taking prescription pain medication, you can comfortably wear a seatbelt, and you can safely maneuver your car and apply brakes.  FOLLOW-UP You should see your doctor in the office for a follow-up appointment approximately 2-3 weeks after your surgery.  You should have been given your post-op/follow-up appointment when your surgery was scheduled.  If you did not receive a post-op/follow-up appointment, make sure   that you call for this appointment within a day or two after you arrive home to insure a convenient appointment time.   WHEN TO CALL YOUR DOCTOR: Fever over 101.0 Inability to urinate Continued bleeding from incision. Increased pain, redness, or drainage from the  incision. Increasing abdominal pain  The clinic staff is available to answer your questions during regular business hours.  Please don't hesitate to call and ask to speak to one of the nurses for clinical concerns.  If you have a medical emergency, go to the nearest emergency room or call 911.  A surgeon from [facility] is always on call at the hospital. [address], [address], [place], [place]  [address] ? [address] [address], [place], [place]   [address] [phone] ? [phone] ? FAX [phone]  

## 2022-04-27 MED ORDER — OXYCODONE HCL 5 MG PO TABS: 5.0000 mg | ORAL_TABLET | Freq: Four times a day (QID) | ORAL | 0 refills | Status: DC | PRN

## 2022-04-27 MED ORDER — POLYETHYLENE GLYCOL 3350 17 G PO PACK: 17.0000 g | PACK | Freq: Every day | ORAL | 0 refills | Status: DC | PRN

## 2022-04-27 MED ORDER — ACETAMINOPHEN 500 MG PO TABS: 1000.0000 mg | ORAL_TABLET | Freq: Four times a day (QID) | ORAL | 0 refills | Status: DC | PRN

## 2022-04-27 NOTE — Progress Notes (Signed)
Patient hasn't had breakfast. Wanting to eat before discharging. AVS already completed. Will discharge as soon as he eats.   Levora Dredge RN

## 2022-04-27 NOTE — Plan of Care (Signed)

## 2022-04-27 NOTE — Progress Notes (Signed)
Explained discharge instructions to patient. Reviewed follow up appointment and next medication administration times. Also reviewed post laparoscopic surgical education. Patient verbalized having an understanding for instructions given. All belongings are in the patient's possession. IV was removed. No other needs verbalized. Transported downstairs for discharge.

## 2022-04-27 NOTE — Anesthesia Postprocedure Evaluation (Signed)
Anesthesia Post Note  Patient: Daniel Miranda  Procedure(s) Performed: APPENDECTOMY LAPAROSCOPIC (Abdomen)     Patient location during evaluation: PACU Anesthesia Type: General Level of consciousness: awake and alert Pain management: pain level controlled Vital Signs Assessment: post-procedure vital signs reviewed and stable Respiratory status: spontaneous breathing, nonlabored ventilation, respiratory function stable and patient connected to nasal cannula oxygen Cardiovascular status: blood pressure returned to baseline, stable and bradycardic Postop Assessment: no apparent nausea or vomiting Anesthetic complications: no   No notable events documented.  Last Vitals:  Vitals:   04/27/22 0759 04/27/22 1117  BP: 107/68 112/66  Pulse: (!) 55 (!) 54  Resp: 17 17  Temp: 36.8 C 36.8 C  SpO2: 94% 94%    Last Pain:  Vitals:   04/27/22 1117  TempSrc: Oral  PainSc:                  Santa Lighter

## 2022-04-27 NOTE — Discharge Summary (Signed)
[facility] Discharge Summary   Patient ID: Daniel Miranda MRN: [id number] DOB/AGE: [birth year] 71 y.o.  Admit date: 04/25/2022 Discharge date: 04/27/2022  Admitting Diagnosis: Acute appendicitis   Discharge Diagnosis Patient Active Problem List   Diagnosis Date Noted   Perforated appendicitis 04/25/2022    Consultants None  Imaging: CT Abdomen Pelvis W Contrast  Result Date: 04/25/2022 CLINICAL DATA:  RLQ abdominal pain EXAM: CT ABDOMEN AND PELVIS WITH CONTRAST TECHNIQUE: Multidetector CT imaging of the abdomen and pelvis was performed using the standard protocol following bolus administration of intravenous contrast. RADIATION DOSE REDUCTION: This exam was performed according to the departmental dose-optimization program which includes automated exposure control, adjustment of the mA and/or kV according to patient size and/or use of iterative reconstruction technique. CONTRAST:  86m OMNIPAQUE IOHEXOL 350 MG/ML SOLN COMPARISON:  None Available. FINDINGS: Lower chest: No acute abnormality. Hepatobiliary: Subcentimeter hypodensity too small to characterize. No gallstones, gallbladder wall thickening, or pericholecystic fluid. No biliary dilatation. Pancreas: No focal lesion. Normal pancreatic contour. No surrounding inflammatory changes. No main pancreatic ductal dilatation. Spleen: Normal in size without focal abnormality. Adrenals/Urinary Tract: No adrenal nodule bilaterally. Bilateral kidneys enhance symmetrically. No hydronephrosis. No hydroureter. The urinary bladder is unremarkable. Stomach/Bowel: Stomach is within normal limits. No evidence of bowel wall thickening or dilatation. Diffuse colonic diverticulosis. The appendix is enlarged in caliber measuring up to 16 cm with associated appendiceal wall thickening and periappendiceal fat stranding. Likely 3 x 1 cm (6:44, 7:54) loculated peripherally enhancing fluid collection along the appendiceal tip. No appendicolith  identified. Vascular/Lymphatic: Mixing artifact noted within the inferior vena cava. Slightly tortuous distal vena cava and aortic bifurcation as well as common iliac vessels. No abdominal aorta or iliac aneurysm. Mild to moderate atherosclerotic plaque of the aorta and its branches. No abdominal, pelvic, or inguinal lymphadenopathy. Reproductive: The prostate is enlarged measuring up to 4.7 cm. Other: Trace simple free fluid within the right lower quadrant. No intraperitoneal free gas. No organized fluid collection. Musculoskeletal: No abdominal wall hernia or abnormality. No suspicious lytic or blastic osseous lesions. No acute displaced fracture. Multilevel degenerative changes of the spine. Mild retrolisthesis of L4 on L5 and L5 on S1. L4 vertebral body hemangioma. IMPRESSION: 1. Acute appendicitis with perforated tip not excluded. No appendicolith. Likely developing periappendiceal abscess measuring 3 x 1 cm. 2. Colonic diverticulosis with no acute diverticulitis. 3.  Aortic Atherosclerosis (ICD10-I70.0). Electronically Signed   By: MIven FinnM.D.   On: 04/25/2022 19:11    Procedures Dr. TMarlou Starks(04/26/2022) - Laparoscopic Appendectomy  Hospital Course:  Daniel Rocksis a 71y.o. [facility] onset central/lower abdominal pain that localized to the RLQ.  Workup showed acute appendicitis.  Patient was admitted and underwent procedure listed above.  Intraoperatively the appendix was found to be very inflamed with abscess. Tolerated procedure well and was transferred to the floor.  Diet was advanced as tolerated.  Patient was kept on IV zosyn perioperatively. On POD1, the patient was voiding well, tolerating diet, ambulating well, pain well controlled, vital signs stable, incisions c/d/i and felt stable for discharge home.  Patient will follow up as below and knows to call with questions or concerns.    I have personally reviewed the patients medication history on the [place]  controlled substance database.     Physical Exam: General:  Alert, NAD, pleasant, comfortable Pulm: rate and effort normal Abd:  Soft, ND, appropriately tender, multiple lap incisions C/D/I with trace ecchymosis but no  erythema or drainage  Allergies as of 04/27/2022   No Known Allergies      Medication List     STOP taking these medications    amoxicillin-clavulanate 875-125 MG tablet Commonly known as: AUGMENTIN       TAKE these medications    acetaminophen 500 MG tablet Commonly known as: TYLENOL Take 2 tablets (1,000 mg total) by mouth every 6 (six) hours as needed for mild pain.   allopurinol 100 MG tablet Commonly known as: ZYLOPRIM Take 100 mg by mouth daily.   amLODipine 5 MG tablet Commonly known as: NORVASC Take 5 mg by mouth daily.   atorvastatin 10 MG tablet Commonly known as: LIPITOR Take 10 mg by mouth 3 (three) times a week.   ibuprofen 200 MG tablet Commonly known as: ADVIL Take 400 mg by mouth daily as needed for headache or moderate pain.   losartan 50 MG tablet Commonly known as: COZAAR Take 50 mg by mouth daily.   oxyCODONE 5 MG immediate release tablet Commonly known as: Oxy IR/ROXICODONE Take 1 tablet (5 mg total) by mouth every 6 (six) hours as needed for severe pain.   polyethylene glycol 17 g packet Commonly known as: MiraLax Take 17 g by mouth daily as needed for mild constipation.   tamsulosin 0.4 MG Caps capsule Commonly known as: FLOMAX Take 0.4 mg by mouth daily.          Follow-up Information     [facility] [place] [facility].   Specialty: General Surgery Why: Please call to confirm your appointment date and time. We are working hard to make this for you. Please bring a copy of your photo ID and insurance card. Please arrive 30 minutes prior to your appointment. Contact information: [address] [place] [address] [phone]                  Signed: Wellington Hampshire,  [facility] 04/27/2022, 9:25 AM Please see Amion for pager number during day hours 7:00am-4:30pm

## 2022-04-28 ENCOUNTER — Encounter (HOSPITAL_COMMUNITY): Payer: Self-pay | Admitting: General Surgery

## 2022-05-01 LAB — SURGICAL PATHOLOGY

## 2022-05-05 DIAGNOSIS — C61 Malignant neoplasm of prostate: Secondary | ICD-10-CM | POA: Diagnosis not present

## 2022-06-19 ENCOUNTER — Other Ambulatory Visit: Payer: Self-pay | Admitting: Urology

## 2022-06-19 DIAGNOSIS — C61 Malignant neoplasm of prostate: Secondary | ICD-10-CM

## 2022-07-18 DIAGNOSIS — Z79899 Other long term (current) drug therapy: Secondary | ICD-10-CM | POA: Diagnosis not present

## 2022-07-18 DIAGNOSIS — Z1331 Encounter for screening for depression: Secondary | ICD-10-CM | POA: Diagnosis not present

## 2022-07-18 DIAGNOSIS — M109 Gout, unspecified: Secondary | ICD-10-CM | POA: Diagnosis not present

## 2022-07-18 DIAGNOSIS — E7800 Pure hypercholesterolemia, unspecified: Secondary | ICD-10-CM | POA: Diagnosis not present

## 2022-07-18 DIAGNOSIS — Z0000 Encounter for general adult medical examination without abnormal findings: Secondary | ICD-10-CM | POA: Diagnosis not present

## 2022-07-18 DIAGNOSIS — N401 Enlarged prostate with lower urinary tract symptoms: Secondary | ICD-10-CM | POA: Diagnosis not present

## 2022-07-18 DIAGNOSIS — Z23 Encounter for immunization: Secondary | ICD-10-CM | POA: Diagnosis not present

## 2022-07-18 DIAGNOSIS — C61 Malignant neoplasm of prostate: Secondary | ICD-10-CM | POA: Diagnosis not present

## 2022-07-18 DIAGNOSIS — I10 Essential (primary) hypertension: Secondary | ICD-10-CM | POA: Diagnosis not present

## 2022-07-18 DIAGNOSIS — Z8249 Family history of ischemic heart disease and other diseases of the circulatory system: Secondary | ICD-10-CM | POA: Diagnosis not present

## 2022-07-18 DIAGNOSIS — E559 Vitamin D deficiency, unspecified: Secondary | ICD-10-CM | POA: Diagnosis not present

## 2022-07-20 ENCOUNTER — Ambulatory Visit
Admission: RE | Admit: 2022-07-20 | Discharge: 2022-07-20 | Disposition: A | Discharge status: Home / Self Care | Payer: Medicare HMO | Source: Ambulatory Visit | Attending: Urology | Admitting: Urology

## 2022-07-20 DIAGNOSIS — C61 Malignant neoplasm of prostate: Secondary | ICD-10-CM

## 2022-07-20 MED ORDER — GADOPICLENOL 0.5 MMOL/ML IV SOLN
8.0000 mL | Freq: Once | INTRAVENOUS | Status: AC | PRN
  Administered 2022-07-20: 8 mL via INTRAVENOUS

## 2022-08-29 DIAGNOSIS — C61 Malignant neoplasm of prostate: Secondary | ICD-10-CM | POA: Diagnosis not present

## 2022-09-04 DIAGNOSIS — R3912 Poor urinary stream: Secondary | ICD-10-CM | POA: Diagnosis not present

## 2022-09-04 DIAGNOSIS — N401 Enlarged prostate with lower urinary tract symptoms: Secondary | ICD-10-CM | POA: Diagnosis not present

## 2022-09-04 DIAGNOSIS — C61 Malignant neoplasm of prostate: Secondary | ICD-10-CM | POA: Diagnosis not present

## 2022-10-04 DIAGNOSIS — H524 Presbyopia: Secondary | ICD-10-CM | POA: Diagnosis not present

## 2022-10-04 DIAGNOSIS — Z0100 Encounter for examination of eyes and vision without abnormal findings: Secondary | ICD-10-CM | POA: Diagnosis not present

## 2022-10-25 DIAGNOSIS — N401 Enlarged prostate with lower urinary tract symptoms: Secondary | ICD-10-CM | POA: Diagnosis not present

## 2022-10-25 DIAGNOSIS — R972 Elevated prostate specific antigen [PSA]: Secondary | ICD-10-CM | POA: Diagnosis not present

## 2022-10-25 DIAGNOSIS — R3912 Poor urinary stream: Secondary | ICD-10-CM | POA: Diagnosis not present

## 2023-04-03 DIAGNOSIS — L570 Actinic keratosis: Secondary | ICD-10-CM | POA: Diagnosis not present

## 2023-04-03 DIAGNOSIS — Z85828 Personal history of other malignant neoplasm of skin: Secondary | ICD-10-CM | POA: Diagnosis not present

## 2023-04-03 DIAGNOSIS — Z08 Encounter for follow-up examination after completed treatment for malignant neoplasm: Secondary | ICD-10-CM | POA: Diagnosis not present

## 2023-04-03 DIAGNOSIS — L814 Other melanin hyperpigmentation: Secondary | ICD-10-CM | POA: Diagnosis not present

## 2023-04-03 DIAGNOSIS — D225 Melanocytic nevi of trunk: Secondary | ICD-10-CM | POA: Diagnosis not present

## 2023-04-03 DIAGNOSIS — L821 Other seborrheic keratosis: Secondary | ICD-10-CM | POA: Diagnosis not present

## 2023-07-16 DIAGNOSIS — Z0100 Encounter for examination of eyes and vision without abnormal findings: Secondary | ICD-10-CM | POA: Diagnosis not present

## 2023-07-24 ENCOUNTER — Other Ambulatory Visit (HOSPITAL_COMMUNITY): Payer: Self-pay | Admitting: Internal Medicine

## 2023-07-24 DIAGNOSIS — E7800 Pure hypercholesterolemia, unspecified: Secondary | ICD-10-CM | POA: Diagnosis not present

## 2023-07-24 DIAGNOSIS — E559 Vitamin D deficiency, unspecified: Secondary | ICD-10-CM | POA: Diagnosis not present

## 2023-07-24 DIAGNOSIS — Z1331 Encounter for screening for depression: Secondary | ICD-10-CM | POA: Diagnosis not present

## 2023-07-24 DIAGNOSIS — Z0000 Encounter for general adult medical examination without abnormal findings: Secondary | ICD-10-CM | POA: Diagnosis not present

## 2023-07-24 DIAGNOSIS — Z79899 Other long term (current) drug therapy: Secondary | ICD-10-CM | POA: Diagnosis not present

## 2023-07-24 DIAGNOSIS — M109 Gout, unspecified: Secondary | ICD-10-CM | POA: Diagnosis not present

## 2023-07-24 DIAGNOSIS — Z23 Encounter for immunization: Secondary | ICD-10-CM | POA: Diagnosis not present

## 2023-08-03 ENCOUNTER — Ambulatory Visit (HOSPITAL_COMMUNITY)
Admission: RE | Admit: 2023-08-03 | Discharge: 2023-08-03 | Disposition: A | Discharge status: Home / Self Care | Payer: Self-pay | Source: Ambulatory Visit | Attending: Internal Medicine | Admitting: Internal Medicine

## 2023-08-03 DIAGNOSIS — E7800 Pure hypercholesterolemia, unspecified: Secondary | ICD-10-CM | POA: Insufficient documentation

## 2023-08-14 ENCOUNTER — Telehealth (HOSPITAL_COMMUNITY): Payer: Self-pay | Admitting: Cardiology

## 2023-08-14 NOTE — Telephone Encounter (Signed)
 Front office called [facility] [place] [phone]) and spoke to a medical assistant for Daniel Neighbor, MD.  Front office spoke directly to a medical assistant and relayed message from referral that this patient is not appropriate for the [facility] and just needs general cardiology.   Medical assistant took information and stated she will pass the information along to the provider.

## 2023-10-08 DIAGNOSIS — L570 Actinic keratosis: Secondary | ICD-10-CM | POA: Diagnosis not present

## 2023-11-06 NOTE — Progress Notes (Signed)
 Cardiology Office Note:  .   Date:  11/07/2023 ID:  Daniel Miranda, DOB July 06, 1950, MRN [id number] PCP: Charlott Dorn LABOR, MD [facility] Providers Cardiologist:  None   Patient Profile: .      PMH Coronary artery disease CT Calcium score 08/09/23 CAC 1248 (81st percentile) LM 43.8, LAD 369, LCx 418, RCA 417 Gout Hypertension Hyperlipidemia       History of Present Illness: .    Discussed the use of AI scribe software for clinical note transcription with the patient, who gave verbal consent to proceed.  History of Present Illness Daniel Miranda is a very pleasant 73 year old male who is here today for new patient consult for elevated coronary calcium score. 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 He is now retired and lives here with his wife. They enjoy traveling and spending time with grandchildren. He enjoys playing golf and walking for exercise. [place] and notes some difference in breathing due to higher elevation but no symptoms of chest pain or dyspnea. History of hypertension and hyperlipidemia for at least a few years.  His atorvastatin was increased following coronary CT results. Also started aspirin 81 mg daily. BP has been well controlled since losartan was increased a few months ago by PCP. Diet is not strictly heart-healthy and he acknowledges need for improvement. Family history includes heart disease; his father died of a heart attack at age 66. His father led a healthy lifestyle and was very conscientious about what he ate. History of rheumatic fever as a child, was told he had a murmur in the past. He denies palpitations, orthopnea, PND, edema, presyncope or syncope   Family history: His family history includes Sudden Cardiac Death in his father.   Discussed the use of AI scribe software for clinical note transcription with the patient, who gave  verbal consent to proceed.  ASCVD Risk Score: ASCVD (Atherosclerotic Cardiovascular Disease) Risk Algorithm including Known ASCVD from AHA/ACC from [email or web address]  on 11/06/2023 ** All calculations should be rechecked by clinician prior to use **  RESULT SUMMARY: 23.1 % Risk of cardiovascular event (coronary or stroke death or non-fatal MI or stroke) in next 10 years.   Moderate- to high-intensity statin recommended because 10-year risk >7.5%  To view statin dosages by intensity, see Evidence section.  INPUTS: History of ASCVD --> 0 = No LDL Cholesterol >=190mg /dL (5.07 mmol/L) --> 0 = No Age --> 72 years Diabetes --> 0 = No Sex --> 1 = Male Total Cholesterol --> 181 mg/dL HDL Cholesterol --> 58 mg/dL Systolic Blood Pressure --> 128 mm Hg Treatment for Hypertension --> 1 = Yes Smoker --> 0 = No Race --> 1 = White  Diet: Specifics not provided Somewhat heart healthy but not restrictive  Activity: Walking 1 hour several days per week Walking golf course carrying clubs  No results found for: LIPOA    ROS: See HPI       Studies Reviewed: SABRA   EKG Interpretation Date/Time:  Wednesday November 07 2023 10:55:22 EDT Ventricular Rate:  53 PR Interval:  222 QRS Duration:  152 QT Interval:  468 QTC Calculation: 439 R Axis:   26  Text Interpretation: Sinus bradycardia with 1st degree A-V block Left bundle branch block No previous ECGs available Confirmed by Percy Browning [phone]) on 11/07/2023 11:11:06 AM      Risk Assessment/Calculations:  Physical Exam:   VS: BP 126/76   Pulse (!) 53   Ht 5' 9 (1.753 m)   Wt 168 lb (76.2 kg)   SpO2 98%   BMI 24.81 kg/m   Wt Readings from Last 3 Encounters:  11/07/23 168 lb (76.2 kg)  04/25/22 169 lb 12.1 oz (77 kg)  05/30/16 170 lb (77.1 kg)     GEN: Well nourished, well developed in no acute distress NECK: No JVD; No carotid bruits CARDIAC: RRR, soft murmur upper right sternal border. No rubs,  gallops RESPIRATORY:  Clear to auscultation without rales, wheezing or rhonchi  ABDOMEN: Soft, non-tender, non-distended EXTREMITIES:  No edema; No deformity     Assessment & Plan Coronary artery calcification   Recent CT with coronary artery calcification score of 1248, placing him in the 81st percentile for his age, with heavy distribution of calcium across LAD, RCA and circumflex. He is active with walking and playing golf and denies chest pain, dyspnea, or other symptoms concerning for angina.  EKG reveals SB at 53 bpm, LBBB. No prior EKG for comparison.  Due to additional risk factors of BBB, hypertension and hyperlipidemia along with significant family history, we will get PET CT to evaluate for ischemia. No bleeding concerns. Continue aspirin, atorvastatin, losartan and amlodipine. LDL is 76 (labs 10/08/23). We will check LP(a) for further risk stratification. Encouraged more focus on heart healthy diet avoiding saturated fat, processed foods, sugar, and simple carbohydrates. Eat a mostly plant based diet of lean protein, whole grains, fruits and vegetables and aim for at least 150 minutes of moderate intensity exercise each week.   Hyperlipidemia LDL Goal < 70 Lipid panel completed 10/08/2023 with total cholesterol 158, HDL 66, LDL 76, triglycerides 87.  His atorvastatin was increased to 20 mg daily following CT calcium score results.  He is close to the target of < 70 mg/dL.  We will additionally check lipoprotein a for further risk stratification.We discussed dietary impacts on LDL and the potential use of PCSK9i if lipoprotein A is elevated. A Mediterranean diet is recommended. Plan: Continue atorvastatin 20 mg daily and encourage adherence to a Mediterranean-style diet. Consider PCSK9i if LP(a) is elevated.   Left bundle branch block   Left bundle branch block noted on EKG today. He is unaware of this finding in the past. We are getting cardiac PET CT for evaluation of ischemia. Will get  echocardiogram to assess heart and valve function.  Heart murmur/History of Rheumatic Fever Soft murmur noted on exam. He reports history of rheumatic fever. No prior echo to his awareness. We will get an echocardiogram to evaluate the heart valves for changes secondary to rheumatic fever.   Hypertension   BP has been well controlled since increasing losartan to 100 mg daily. Recent home BP monitoring reported to PCP. Stable renal function on labs completed 10/08/23. No change in anti-hypertensive therapy today.  Sinus bradycardia   EKG reveals sinus bradycardia at 53 bpm. He is asymptomatic. He is not on any AV nodal blocking agents. Would likely avoid BB or CCB in the future.   Cardiac Risk Assessment ASCVD risk score 23.1%. We reviewed the factors associated with this score. He does not have history of smoking or diabetes. History of hypertension with well-controlled BP on current anti-hypertensive therapy. Lipids generally well-controlled, consider additional agent for LDL goal < 70, currently at 76. Checking LP(a) for further risk stratification. We are getting cardiac PET and echo for evaluation of ischemia and heart/valve function. Focus on  secondary prevention including heart healthy mostly plant based diet avoiding saturated fat, processed foods, simple carbohydrates, and sugar along with aiming for at least 150 minutes of moderate intensity exercise each week.      Plan/Goals: 1: Seek to eat a more heart healthy diet avoiding saturated fat, sugar, simple carbohydrates and processed food       Informed Consent   Shared Decision Making/Informed Consent The risks [chest pain, shortness of breath, cardiac arrhythmias, dizziness, blood pressure fluctuations, myocardial infarction, stroke/transient ischemic attack, nausea, vomiting, allergic reaction, radiation exposure, metallic taste sensation and life-threatening complications (estimated to be 1 in 10,000)], benefits (risk  stratification, diagnosing coronary artery disease, treatment guidance) and alternatives of a cardiac PET stress test were discussed in detail with Mr. Weninger and he agrees to proceed.     Dispo: 3-4 months with me  Signed, Rosaline Bane, NP-C

## 2023-11-07 ENCOUNTER — Ambulatory Visit (HOSPITAL_BASED_OUTPATIENT_CLINIC_OR_DEPARTMENT_OTHER): Admitting: Nurse Practitioner

## 2023-11-07 ENCOUNTER — Encounter (HOSPITAL_BASED_OUTPATIENT_CLINIC_OR_DEPARTMENT_OTHER): Payer: Self-pay | Admitting: Nurse Practitioner

## 2023-11-07 ENCOUNTER — Other Ambulatory Visit (HOSPITAL_BASED_OUTPATIENT_CLINIC_OR_DEPARTMENT_OTHER): Payer: Self-pay | Admitting: *Deleted

## 2023-11-07 VITALS — BP 126/76 | HR 53 | Ht 69.0 in | Wt 168.0 lb

## 2023-11-07 DIAGNOSIS — R011 Cardiac murmur, unspecified: Secondary | ICD-10-CM

## 2023-11-07 DIAGNOSIS — E785 Hyperlipidemia, unspecified: Secondary | ICD-10-CM

## 2023-11-07 DIAGNOSIS — R001 Bradycardia, unspecified: Secondary | ICD-10-CM

## 2023-11-07 DIAGNOSIS — Z8679 Personal history of other diseases of the circulatory system: Secondary | ICD-10-CM | POA: Diagnosis not present

## 2023-11-07 DIAGNOSIS — R931 Abnormal findings on diagnostic imaging of heart and coronary circulation: Secondary | ICD-10-CM | POA: Diagnosis not present

## 2023-11-07 DIAGNOSIS — I447 Left bundle-branch block, unspecified: Secondary | ICD-10-CM | POA: Diagnosis not present

## 2023-11-07 DIAGNOSIS — Z7189 Other specified counseling: Secondary | ICD-10-CM | POA: Diagnosis not present

## 2023-11-07 NOTE — Patient Instructions (Signed)
 Medication Instructions:   Your physician recommends that you continue on your current medications as directed. Please refer to the Current Medication list given to you today.   *If you need a refill on your cardiac medications before your next appointment, please call your pharmacy*  Lab Work:  TODAY/LPA  If you have labs (blood work) drawn today and your tests are completely normal, you will receive your results only by: MyChart Message (if you have MyChart) OR A paper copy in the mail If you have any lab test that is abnormal or we need to change your treatment, we will call you to review the results.  Testing/Procedures:  Your physician has requested that you have an echocardiogram. Echocardiography is a painless test that uses sound waves to create images of your heart. It provides your doctor with information about the size and shape of your heart and how well your heart's chambers and valves are working. This procedure takes approximately one hour. There are no restrictions for this procedure. Please do NOT wear cologne, aftershave, or lotions (deodorant is allowed). Please arrive 15 minutes prior to your appointment time.    Please report to Radiology at [facility] Main Entrance, medical mall, 30 mins prior to your test.  [address]  [place], [place]  How to Prepare for Your Cardiac PET/CT Stress Test:  Nothing to eat or drink, except water, 3 hours prior to arrival time.  NO caffeine/decaffeinated products, or chocolate 12 hours prior to arrival. (Please note decaffeinated beverages (teas/coffees) still contain caffeine).  If you have caffeine within 12 hours prior, the test will need to be rescheduled.  Medication instructions: Do not take erectile dysfunction medications for 72 hours prior to test (sildenafil, tadalafil) Do not take tamsulosin the day before or morning of test  You may take your remaining medications with water.  NO  perfume, cologne or lotion on chest or abdomen area.  Total time is 1 to 2 hours; you may want to bring reading material for the waiting time.   In preparation for your appointment, medication and supplies will be purchased.  Appointment availability is limited, so if you need to cancel or reschedule, please call the Radiology Department Scheduler at [phone] 24 hours in advance to avoid a cancellation fee of $100.00  What to Expect When you Arrive:  Once you arrive and check in for your appointment, you will be taken to a preparation room within the Radiology Department.  A technologist or Nurse will obtain your medical history, verify that you are correctly prepped for the exam, and explain the procedure.  Afterwards, an IV will be started in your arm and electrodes will be placed on your skin for EKG monitoring during the stress portion of the exam. Then you will be escorted to the PET/CT scanner.  There, staff will get you positioned on the scanner and obtain a blood pressure and EKG.  During the exam, you will continue to be connected to the EKG and blood pressure machines.  A small, safe amount of a radioactive tracer will be injected in your IV to obtain a series of pictures of your heart along with an injection of a stress agent.    After your Exam:  It is recommended that you eat a meal and drink a caffeinated beverage to counter act any effects of the stress agent.  Drink plenty of fluids for the remainder of the day and urinate frequently for the first couple of hours  after the exam.  Your doctor will inform you of your test results within 7-10 business days.  For more information and frequently asked questions, please visit our website: [email or web address]  For questions about your test or how to prepare for your test, please call: Cardiac Imaging Nurse Navigators Office: [phone]    Follow-Up: At [facility], you and your health needs are our  priority.  As part of our continuing mission to provide you with exceptional heart care, our providers are all part of one team.  This team includes your primary Cardiologist (physician) and Advanced Practice Providers or APPs (Physician Assistants and Nurse Practitioners) who all work together to provide you with the care you need, when you need it.  Your next appointment:   3 month(s)  Provider:   Slater Duncan, NP    We recommend signing up for the patient portal called MyChart.  Sign up information is provided on this After Visit Summary.  MyChart is used to connect with patients for Virtual Visits (Telemedicine).  Patients are able to view lab/test results, encounter notes, upcoming appointments, etc.  Non-urgent messages can be sent to your provider as well.   To learn more about what you can do with MyChart, go to [email or web address].   Other Instructions  Adopting a Healthy Lifestyle.   Weight: Know what a healthy weight is for you (roughly BMI <25) and aim to maintain this. You can calculate your body mass index on your smart phone. Unfortunately, this is not the most accurate measure of healthy weight, but it is the simplest measurement to use. A more accurate measurement involves body scanning which measures lean muscle, fat tissue and bony density. We do not have this equipment at [facility].    Diet: Aim for 7+ servings of fruits and vegetables daily Limit animal fats in diet for cholesterol and heart health - choose grass fed whenever available Avoid highly processed foods (fast food burgers, tacos, fried chicken, pizza, hot dogs, french fries)  Saturated fat comes in the form of butter, lard, coconut oil, margarine, partially hydrogenated oils, and fat in meat. These increase your risk of cardiovascular disease.  Use healthy plant oils, such as olive, canola, soy, corn, sunflower and peanut.  Whole foods such as fruits, vegetables and whole grains have fiber  Men need >  38 grams of fiber per day Women need > 25 grams of fiber per day  Load up on vegetables and fruits - one-half of your plate: Aim for color and variety, and remember that potatoes dont count. Go for whole grains - one-quarter of your plate: Whole wheat, barley, wheat berries, quinoa, oats, brown rice, and foods made with them. If you want pasta, go with whole wheat pasta. Protein power - one-quarter of your plate: Fish, chicken, beans, and nuts are all healthy, versatile protein sources. Limit red meat. You need carbohydrates for energy! The type of carbohydrate is more important than the amount. Choose carbohydrates such as vegetables, fruits, whole grains, beans, and nuts in the place of white rice, white pasta, potatoes (baked or fried), macaroni and cheese, cakes, cookies, and donuts.  If youre thirsty, drink water. Coffee and tea are good in moderation, but skip sugary drinks and limit milk and dairy products to one or two daily servings. Keep sugar intake at 6 teaspoons or 24 grams or LESS       Exercise: Aim for 150 min of moderate intensity exercise weekly for heart health, and weights twice weekly for bone  health Stay active - any steps are better than no steps! Aim for 7-9 hours of sleep daily          Mediterranean Diet  Why follow it? Research shows. Those who follow the Mediterranean diet have a reduced risk of heart disease  The diet is associated with a reduced incidence of Parkinson's and Alzheimer's diseases People following the diet may have longer life expectancies and lower rates of chronic diseases  The Dietary Guidelines for Americans recommends the Mediterranean diet as an eating plan to promote health and prevent disease  What Is the Mediterranean Diet?  Healthy eating plan based on typical foods and recipes of Mediterranean-style cooking The diet is primarily a plant based diet; these foods should make up a majority of meals   Starches - Plant based foods should  make up a majority of meals - They are an important sources of vitamins, minerals, energy, antioxidants, and fiber - Choose whole grains, foods high in fiber and minimally processed items  - Typical grain sources include wheat, oats, barley, corn, brown rice, bulgar, farro, millet, polenta, couscous  - Various types of beans include chickpeas, lentils, fava beans, black beans, white beans   Fruits  Veggies - Large quantities of antioxidant rich fruits & veggies; 6 or more servings  - Vegetables can be eaten raw or lightly drizzled with oil and cooked  - Vegetables common to the traditional Mediterranean Diet include: artichokes, arugula, beets, broccoli, brussel sprouts, cabbage, carrots, celery, collard greens, cucumbers, eggplant, kale, leeks, lemons, lettuce, mushrooms, okra, onions, peas, peppers, potatoes, pumpkin, radishes, rutabaga, shallots, spinach, sweet potatoes, turnips, zucchini - Fruits common to the Mediterranean Diet include: apples, apricots, avocados, cherries, clementines, dates, figs, grapefruits, grapes, melons, nectarines, oranges, peaches, pears, pomegranates, strawberries, tangerines  Fats - Replace butter and margarine with healthy oils, such as olive oil, canola oil, and tahini  - Limit nuts to no more than a handful a day  - Nuts include walnuts, almonds, pecans, pistachios, pine nuts  - Limit or avoid candied, honey roasted or heavily salted nuts - Olives are central to the [employer] - can be eaten whole or used in a variety of dishes   Meats Protein - Limiting red meat: no more than a few times a month - When eating red meat: choose lean cuts and keep the portion to the size of deck of cards - Eggs: approx. 0 to 4 times a week  - Fish and lean poultry: at least 2 a week  - Healthy protein sources include, chicken, [place], lean beef, lamb - Increase intake of seafood such as tuna, salmon, trout, mackerel, shrimp, scallops - Avoid or limit high fat processed  meats such as sausage and bacon  Dairy - Include moderate amounts of low fat dairy products  - Focus on healthy dairy such as fat free yogurt, skim milk, low or reduced fat cheese - Limit dairy products higher in fat such as whole or 2% milk, cheese, ice cream  Alcohol - Moderate amounts of red wine is ok  - No more than 5 oz daily for women (all ages) and men older than age 76  - No more than 10 oz of wine daily for men younger than 63  Other - Limit sweets and other desserts  - Use herbs and spices instead of salt to flavor foods  - Herbs and spices common to the traditional Mediterranean Diet include: basil, bay leaves, chives, cloves, cumin, fennel, garlic, lavender, marjoram, mint,  oregano, parsley, pepper, rosemary, sage, savory, sumac, tarragon, thyme   It's not just a diet, it's a lifestyle:  The Mediterranean diet includes lifestyle factors typical of those in the region  Foods, drinks and meals are best eaten with others and savored Daily physical activity is important for overall good health This could be strenuous exercise like running and aerobics This could also be more leisurely activities such as walking, housework, yard-work, or taking the stairs Moderation is the key; a balanced and healthy diet accommodates most foods and drinks Consider portion sizes and frequency of consumption of certain foods   Meal Ideas & Options:  Breakfast:  Whole wheat toast or whole wheat English muffins with peanut butter & hard boiled egg Steel cut oats topped with apples & cinnamon and skim milk  Fresh fruit: banana, strawberries, melon, berries, peaches  Smoothies: strawberries, bananas, greek yogurt, peanut butter Low fat greek yogurt with blueberries and granola  Egg white omelet with spinach and mushrooms Breakfast couscous: whole wheat couscous, apricots, skim milk, cranberries  Sandwiches:  Hummus and grilled vegetables (peppers, zucchini, squash) on whole wheat bread   Grilled  chicken on whole wheat pita with lettuce, tomatoes, cucumbers or tzatziki  [place] salad on whole wheat bread: tuna salad made with greek yogurt, olives, red peppers, capers, green onions Garlic rosemary lamb pita: lamb sauted with garlic, rosemary, salt & pepper; add lettuce, cucumber, greek yogurt to pita - flavor with lemon juice and black pepper  Seafood:  Mediterranean grilled salmon, seasoned with garlic, basil, parsley, lemon juice and black pepper Shrimp, lemon, and spinach whole-grain pasta salad made with low fat greek yogurt  Seared scallops with lemon orzo  Seared tuna steaks seasoned salt, pepper, coriander topped with tomato mixture of olives, tomatoes, olive oil, minced garlic, parsley, green onions and cappers  Meats:  Herbed greek chicken salad with kalamata olives, cucumber, feta  Red bell peppers stuffed with spinach, bulgur, lean ground beef (or lentils) & topped with feta   Kebabs: skewers of chicken, tomatoes, onions, zucchini, squash  [place] burgers: made with red onions, mint, dill, lemon juice, feta cheese topped with roasted red peppers Vegetarian Cucumber salad: cucumbers, artichoke hearts, celery, red onion, feta cheese, tossed in olive oil & lemon juice  Hummus and whole grain pita points with a greek salad (lettuce, tomato, feta, olives, cucumbers, red onion) Lentil soup with celery, carrots made with vegetable broth, garlic, salt and pepper  Tabouli salad: parsley, bulgur, mint, scallions, cucumbers, tomato, radishes, lemon juice, olive oil, salt and pepper.

## 2023-11-08 ENCOUNTER — Encounter (HOSPITAL_BASED_OUTPATIENT_CLINIC_OR_DEPARTMENT_OTHER): Payer: Self-pay | Admitting: Nurse Practitioner

## 2023-11-08 LAB — LIPOPROTEIN A (LPA): Lipoprotein (a): 306.7 nmol/L — ABNORMAL HIGH (ref <75.0)

## 2023-11-09 ENCOUNTER — Ambulatory Visit: Payer: Self-pay | Admitting: Nurse Practitioner

## 2023-11-09 ENCOUNTER — Other Ambulatory Visit (HOSPITAL_COMMUNITY): Payer: Self-pay

## 2023-11-09 ENCOUNTER — Telehealth: Payer: Self-pay

## 2023-11-09 ENCOUNTER — Encounter (HOSPITAL_BASED_OUTPATIENT_CLINIC_OR_DEPARTMENT_OTHER): Payer: Self-pay

## 2023-11-09 MED ORDER — REPATHA SURECLICK 140 MG/ML ~~LOC~~ SOAJ: 140.0000 mg | SUBCUTANEOUS | 3 refills | Status: AC

## 2023-11-09 NOTE — Addendum Note (Signed)
 Addended by: Guss Legacy on: 11/09/2023 01:27 PM   Modules accepted: Orders

## 2023-11-09 NOTE — Telephone Encounter (Signed)
 PA request has been Submitted. New Encounter has been or will be created for follow up. For additional info see Pharmacy Prior Auth telephone encounter from 11/09/23.

## 2023-11-09 NOTE — Telephone Encounter (Signed)
 Pharmacy Patient Advocate Encounter  Received notification from [facility] that Prior Authorization for REPATHA has been APPROVED from 05/23/23 to 05/21/24. Ran test claim, Copay is $144.24. This test claim was processed through [facility]- copay amounts may vary at other pharmacies due to pharmacy/plan contracts, or as the patient moves through the different stages of their insurance plan.

## 2023-11-09 NOTE — Telephone Encounter (Signed)
 Pharmacy Patient Advocate Encounter   Received notification from Physician's Office that prior authorization for REPATHA is required/requested.   Insurance verification completed.   The patient is insured through [facility] .   Per test claim: PA required; PA submitted to above mentioned insurance via CoverMyMeds Key/confirmation #/EOC [facility] Status is pending

## 2023-11-09 NOTE — Telephone Encounter (Signed)
**Note De-identified  [place] Obfuscation** Please advise 

## 2023-12-10 ENCOUNTER — Encounter (HOSPITAL_BASED_OUTPATIENT_CLINIC_OR_DEPARTMENT_OTHER): Payer: Self-pay | Admitting: *Deleted

## 2023-12-10 ENCOUNTER — Other Ambulatory Visit (HOSPITAL_BASED_OUTPATIENT_CLINIC_OR_DEPARTMENT_OTHER): Payer: Self-pay | Admitting: *Deleted

## 2023-12-10 ENCOUNTER — Telehealth (HOSPITAL_BASED_OUTPATIENT_CLINIC_OR_DEPARTMENT_OTHER): Payer: Self-pay | Admitting: *Deleted

## 2023-12-10 DIAGNOSIS — R931 Abnormal findings on diagnostic imaging of heart and coronary circulation: Secondary | ICD-10-CM

## 2023-12-10 DIAGNOSIS — E785 Hyperlipidemia, unspecified: Secondary | ICD-10-CM

## 2023-12-10 NOTE — Telephone Encounter (Signed)
 S/w pt is aware that PET test was canceled due to insurance. Scheduled pt for Lexi on 7/23 will send pt all instructions through mychart.

## 2023-12-10 NOTE — Telephone Encounter (Signed)
-----   Message from Percy Rosaline HERO sent at 12/10/2023  4:06 PM EDT ----- Regarding: FW: AUTH DENIAL Please contact pt and advise insurance has denied PET CT. I recommend we get a lexiscan  myoview  which will give us  an indication if there is decreased blood flow through the heart arteries. That is the test insurance wants him to have. ----- Message ----- From: Hayden Gale HERO Sent: 12/10/2023   3:49 PM EDT To: Camie HERO Shutter, RN; Chantal JAYSON Requena, RN; Mi# Subject: Daniel Miranda                                    Hello,   [place] has denied auth for PET scan.  Katelyn, are you able to take pt off schedule? Following are the details.   Notice of Denial of Medical Coverage Date: 12/10/2023 Member number: [id number] Name: Klaus Casteneda Coverage for your medical services/items was denied We've denied the medical services/items listed below that you or your doctor requested: [phone] Myocardial (heart) imaging, positron emission tomography with computed tomography transmission (PET/CT) perfusion study, is a type of picture study that looks at blood supply to the heart under stress conditions (similar to exercise) or at rest.  Why was coverage denied? We denied the medical services/items listed above because: Your doctor told us  that there is a concern related to your heart. An imaging study was asked for. We cannot approve this request because: This test is supported for any of these reasons.  To recheck your known CAD (coronary artery disease) because you are having chest pain. This chest pain is not normal for your problem. CAD is a disease of the blood vessels that provide blood to your heart muscle.  Results of your stress ECG (electrocardiogram) test were not normal, or your doctor thinks that the results that showed a problem are false. A stress ECG is a tracing of your heart's actions that is done while you walk or jog on a treadmill.  To see if your CAD could be causing  symptoms that could be related to your heart. These symptoms could include passing out for an unknown reason or a rapid heart rate that starts in the lower part of your heart.  To check your heart muscle's function after the blood flow has been restored or your doctor is using drugs to treat you.  One of the other reasons listed in this guideline. The details sent to us  do not show any of these reasons. This finding was based on Medicare Local Coverage Determinations (LCD): [id number] Cardiac Radionuclide Imaging.  Thanks,  [employer]

## 2023-12-11 ENCOUNTER — Telehealth (HOSPITAL_COMMUNITY): Payer: Self-pay | Admitting: *Deleted

## 2023-12-11 ENCOUNTER — Other Ambulatory Visit (HOSPITAL_BASED_OUTPATIENT_CLINIC_OR_DEPARTMENT_OTHER): Payer: Self-pay | Admitting: Nurse Practitioner

## 2023-12-11 DIAGNOSIS — E785 Hyperlipidemia, unspecified: Secondary | ICD-10-CM

## 2023-12-11 DIAGNOSIS — R931 Abnormal findings on diagnostic imaging of heart and coronary circulation: Secondary | ICD-10-CM

## 2023-12-11 NOTE — Telephone Encounter (Signed)
 Left message on voicemail per DPR in reference to upcoming appointment scheduled on 12/12/23 with detailed instructions given per Myocardial Perfusion Study Information Sheet for the test. LM to arrive 15 minutes early, and that it is imperative to arrive on time for appointment to keep from having the test rescheduled. If you need to cancel or reschedule your appointment, please call the office within 24 hours of your appointment. Failure to do so may result in a cancellation of your appointment, and a $50 no show fee. Phone number given for call back for any questions. Claudene Ronal Quale, RN

## 2023-12-12 ENCOUNTER — Ambulatory Visit (HOSPITAL_COMMUNITY)
Admission: RE | Admit: 2023-12-12 | Discharge: 2023-12-12 | Disposition: A | Discharge status: Home / Self Care | Source: Ambulatory Visit | Attending: Internal Medicine | Admitting: Internal Medicine

## 2023-12-12 DIAGNOSIS — R931 Abnormal findings on diagnostic imaging of heart and coronary circulation: Secondary | ICD-10-CM | POA: Diagnosis not present

## 2023-12-12 DIAGNOSIS — I700 Atherosclerosis of aorta: Secondary | ICD-10-CM | POA: Insufficient documentation

## 2023-12-12 DIAGNOSIS — E785 Hyperlipidemia, unspecified: Secondary | ICD-10-CM | POA: Insufficient documentation

## 2023-12-12 DIAGNOSIS — I2510 Atherosclerotic heart disease of native coronary artery without angina pectoris: Secondary | ICD-10-CM | POA: Diagnosis not present

## 2023-12-12 DIAGNOSIS — Z136 Encounter for screening for cardiovascular disorders: Secondary | ICD-10-CM | POA: Diagnosis not present

## 2023-12-12 LAB — MYOCARDIAL PERFUSION IMAGING
Base ST Depression (mm): 0 mm
LV dias vol: 149 mL (ref 62–150)
LV sys vol: 69 mL (ref 4.2–5.8)
Nuc Stress EF: 54 %
Peak HR: 68 {beats}/min
Rest HR: 44 {beats}/min
Rest Nuclear Isotope Dose: 10.7 mCi
SDS: 0
SRS: 2
SSS: 1
ST Depression (mm): 0 mm
Stress Nuclear Isotope Dose: 30.6 mCi
TID: 0.99

## 2023-12-12 MED ORDER — REGADENOSON 0.4 MG/5ML IV SOLN
0.4000 mg | Freq: Once | INTRAVENOUS | Status: AC
  Administered 2023-12-12: 0.4 mg via INTRAVENOUS

## 2023-12-12 MED ORDER — REGADENOSON 0.4 MG/5ML IV SOLN
INTRAVENOUS | Status: AC
  Filled 2023-12-12: qty 5

## 2023-12-12 MED ORDER — TECHNETIUM TC 99M TETROFOSMIN IV KIT
30.6000 | PACK | Freq: Once | INTRAVENOUS | Status: AC | PRN
  Administered 2023-12-12: 30.6 via INTRAVENOUS

## 2023-12-12 MED ORDER — TECHNETIUM TC 99M TETROFOSMIN IV KIT
10.7000 | PACK | Freq: Once | INTRAVENOUS | Status: AC | PRN
  Administered 2023-12-12: 10.7 via INTRAVENOUS

## 2023-12-13 ENCOUNTER — Ambulatory Visit: Payer: Self-pay | Admitting: Nurse Practitioner

## 2023-12-13 ENCOUNTER — Ambulatory Visit

## 2023-12-20 DIAGNOSIS — E7800 Pure hypercholesterolemia, unspecified: Secondary | ICD-10-CM | POA: Diagnosis not present

## 2023-12-20 DIAGNOSIS — I10 Essential (primary) hypertension: Secondary | ICD-10-CM | POA: Diagnosis not present

## 2023-12-20 DIAGNOSIS — C61 Malignant neoplasm of prostate: Secondary | ICD-10-CM | POA: Diagnosis not present

## 2023-12-20 DIAGNOSIS — N401 Enlarged prostate with lower urinary tract symptoms: Secondary | ICD-10-CM | POA: Diagnosis not present

## 2023-12-27 ENCOUNTER — Ambulatory Visit (INDEPENDENT_AMBULATORY_CARE_PROVIDER_SITE_OTHER)

## 2023-12-27 DIAGNOSIS — R931 Abnormal findings on diagnostic imaging of heart and coronary circulation: Secondary | ICD-10-CM

## 2023-12-27 DIAGNOSIS — Z8679 Personal history of other diseases of the circulatory system: Secondary | ICD-10-CM

## 2023-12-27 DIAGNOSIS — I358 Other nonrheumatic aortic valve disorders: Secondary | ICD-10-CM | POA: Diagnosis not present

## 2023-12-27 DIAGNOSIS — I77810 Thoracic aortic ectasia: Secondary | ICD-10-CM | POA: Diagnosis not present

## 2023-12-27 DIAGNOSIS — I447 Left bundle-branch block, unspecified: Secondary | ICD-10-CM

## 2023-12-27 DIAGNOSIS — R011 Cardiac murmur, unspecified: Secondary | ICD-10-CM | POA: Diagnosis not present

## 2023-12-27 DIAGNOSIS — I5030 Unspecified diastolic (congestive) heart failure: Secondary | ICD-10-CM | POA: Diagnosis not present

## 2023-12-27 LAB — ECHOCARDIOGRAM COMPLETE
Area-P 1/2: 3.42 cm2
S' Lateral: 3.63 cm

## 2024-01-03 DIAGNOSIS — C61 Malignant neoplasm of prostate: Secondary | ICD-10-CM | POA: Diagnosis not present

## 2024-01-20 DIAGNOSIS — E7800 Pure hypercholesterolemia, unspecified: Secondary | ICD-10-CM | POA: Diagnosis not present

## 2024-01-20 DIAGNOSIS — C61 Malignant neoplasm of prostate: Secondary | ICD-10-CM | POA: Diagnosis not present

## 2024-01-20 DIAGNOSIS — N401 Enlarged prostate with lower urinary tract symptoms: Secondary | ICD-10-CM | POA: Diagnosis not present

## 2024-01-20 DIAGNOSIS — I10 Essential (primary) hypertension: Secondary | ICD-10-CM | POA: Diagnosis not present

## 2024-02-13 NOTE — Progress Notes (Unsigned)
 Cardiology Office Note:  .   Date:  02/14/2024 ID:  Daniel Miranda, DOB Sep 05, 1950, MRN [id number] PCP: Charlott Dorn LABOR, MD [facility] Providers Cardiologist:  None   Patient Profile: .      PMH Coronary artery disease CT Calcium score 08/09/23 CAC 1248 (81st percentile) LM 43.8, LAD 369, LCx 418, RCA 417 Gout Hypertension Hyperlipidemia     Referred to cardiology and seen by me on 11/07/23 as new patient consult for elevated coronary calcium score. 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 Is now retired and lives here with his wife. Enjoys traveling and spending time with grandchildren, playing golf and walking for exercise. [place] and [place] some difference in breathing due to higher elevation but no symptoms of chest pain or dyspnea. History of hypertension and hyperlipidemia for at least a few years. Atorvastatin was increased following coronary CT results and started aspirin 81 mg daily. BP has been well controlled since losartan was increased a few months ago by PCP. Diet is not strictly heart-healthy and he acknowledges need for improvement. Family history includes heart disease; his father died of a heart attack at age 27. His father led a healthy lifestyle and was very conscientious about what he ate. History of rheumatic fever as a child, was told he had a murmur in the past. He denies palpitations, orthopnea, PND, edema, presyncope or syncope.  ASCVD risk score 23.1%.  LDL cholesterol on recent panel was 104, triglycerides 107, HDL 58, and total cholesterol 181.  Lexiscan  Myoview  12/12/2023 was low risk with no evidence of infarction or ischemia.  Echo 12/27/2023 revealed low normal LVEF 50 to 55%, G1 DD, mildly dilated LA, aortic valve calcification with no evidence of stenosis, mild dilatation of aortic root at 39 mm and borderline dilatation of ascending aorta at 37 mm.   LP(a) significantly elevated at 306.7.  Recommendation to start Repatha  140 mg every 14 days.    History of Present Illness: .    Discussed the use of AI scribe software for clinical note transcription with the patient, who gave verbal consent to proceed.  History of Present Illness Daniel Miranda is a very pleasant 73 year old male who is here today for follow-up of CAD. We discussed cardiac test results in detail. He is on Repatha  injections for LDL cholesterol management without issues. History of bradycardia without symptoms such as lightheadedness or dizziness. Blood pressure at home is consistently around 110/78 mmHg, though higher today due to nervousness. He remains active without concerns regarding physical activity.  He denies chest pain, shortness of breath, orthopnea, PND, edema, presyncope, syncope. He does not have any concerning cardiac symptoms.    Family history: His family history includes Sudden Cardiac Death in his father.    Lipoprotein (a)  Date/Time Value Ref Range Status  11/07/2023 11:53 AM 306.7 (H) <75.0 nmol/L Final    Comment:    Note:  Values greater than or equal to 75.0 nmol/L may        indicate an independent risk factor for CHD,        but must be evaluated with caution when applied        to non-Caucasian populations due to the        influence of genetic factors on Lp(a) across        ethnicities.       ROS: See  HPI       Studies Reviewed: .          Risk Assessment/Calculations:             Physical Exam:   VS: BP 130/80   Pulse (!) 55   Ht 5' 9 (1.753 m)   Wt 170 lb (77.1 kg)   SpO2 97%   BMI 25.10 kg/m   Wt Readings from Last 3 Encounters:  02/14/24 170 lb (77.1 kg)  11/07/23 168 lb (76.2 kg)  04/25/22 169 lb 12.1 oz (77 kg)     GEN: Well nourished, well developed in no acute distress NECK: No JVD; No carotid bruits CARDIAC: RRR, soft murmur upper right sternal border. No rubs, gallops RESPIRATORY:  Clear to auscultation  without rales, wheezing or rhonchi  ABDOMEN: Soft, non-tender, non-distended EXTREMITIES:  No edema; No deformity     Assessment & Plan Coronary artery calcification  Cardiac risk  Recent CT with coronary artery calcification score of 1248, placing him in the 81st percentile for his age, with heavy distribution of calcium across LAD, RCA and circumflex. Elevated ASCVD risk score of 23.1%. He underwent nuclear stress test 12/12/23 that was low risk, no evidence of ischemia or infarction.  He remains active and denies chest pain, dyspnea, or other symptoms concerning for angina.  No indication for further ischemia evaluation at this time. No bleeding concerns.  -Continue aspirin, atorvastatin, Repatha , losartan and amlodipine - Encouraged heart healthy diet avoiding saturated fat, processed foods, sugar, and simple carbohydrates -Aim for at least 150 minutes of moderate intensity exercise each week  Hyperlipidemia LDL Goal < 55 Elevated LP (a)  LPA is significantly elevated at 306.7. Lipid panel completed 10/08/2023 with total cholesterol 158, HDL 66, LDL 76, triglycerides 87.  He was started on Repatha  for management of hyperlipidemia and elevated LP(a) and is tolerating the medication without concerning side effects.  He is overall feeling well.  We need to reassess lipids for surveillance on lipid-lowering therapy. -Will recheck lipid panel when he can return fasting - Continue Repatha  140 mg every 14 days  - Continue atorvastatin 20 mg daily  - Continue Mediterranean-style diet avoiding processed food, saturated fat, sugar and other simple carbohydrates -Continue regular exercise  Left bundle branch block Sinus bradycardia    Left bundle branch block noted on EKG. History of sinus bradycardia. Echo 12/27/23 with low normal LVEF 50-55%.  He remains active without symptoms of lightheadedness, dizziness, presyncope, syncope  Hypertension   BP has been well controlled. No change in  anti-hypertensive therapy today. -Continue losartan, amlodipine  Mild aortic valve calcification   History of rheumatic fever  Echocardiogram reveals mild aortic valve calcification, potentially influenced by lipoprotein(a), but the current status is mild.  No additional concerning valvular disease.  He is asymptomatic.  -Continue to monitor clinically for now        Dispo: 1 year with me  Signed, Rosaline Bane, NP-C

## 2024-02-14 ENCOUNTER — Ambulatory Visit (HOSPITAL_BASED_OUTPATIENT_CLINIC_OR_DEPARTMENT_OTHER): Admitting: Nurse Practitioner

## 2024-02-14 ENCOUNTER — Encounter (HOSPITAL_BASED_OUTPATIENT_CLINIC_OR_DEPARTMENT_OTHER): Payer: Self-pay | Admitting: Nurse Practitioner

## 2024-02-14 VITALS — BP 130/80 | HR 55 | Ht 69.0 in | Wt 170.0 lb

## 2024-02-14 DIAGNOSIS — E785 Hyperlipidemia, unspecified: Secondary | ICD-10-CM

## 2024-02-14 DIAGNOSIS — Z8679 Personal history of other diseases of the circulatory system: Secondary | ICD-10-CM | POA: Diagnosis not present

## 2024-02-14 DIAGNOSIS — I359 Nonrheumatic aortic valve disorder, unspecified: Secondary | ICD-10-CM

## 2024-02-14 DIAGNOSIS — R001 Bradycardia, unspecified: Secondary | ICD-10-CM | POA: Diagnosis not present

## 2024-02-14 DIAGNOSIS — Z7189 Other specified counseling: Secondary | ICD-10-CM

## 2024-02-14 DIAGNOSIS — R931 Abnormal findings on diagnostic imaging of heart and coronary circulation: Secondary | ICD-10-CM

## 2024-02-14 DIAGNOSIS — I447 Left bundle-branch block, unspecified: Secondary | ICD-10-CM | POA: Diagnosis not present

## 2024-02-14 DIAGNOSIS — I10 Essential (primary) hypertension: Secondary | ICD-10-CM | POA: Diagnosis not present

## 2024-02-14 NOTE — Patient Instructions (Signed)
 Medication Instructions:   Your physician recommends that you continue on your current medications as directed. Please refer to the Current Medication list given to you today.   *If you need a refill on your cardiac medications before your next appointment, please call your pharmacy*  Lab Work:  None ordered.  If you have labs (blood work) drawn today and your tests are completely normal, you will receive your results only by: MyChart Message (if you have MyChart) OR A paper copy in the mail If you have any lab test that is abnormal or we need to change your treatment, we will call you to review the results.  Testing/Procedures:  None ordered.  Follow-Up: At [facility], you and your health needs are our priority.  As part of our continuing mission to provide you with exceptional heart care, our providers are all part of one team.  This team includes your primary Cardiologist (physician) and Advanced Practice Providers or APPs (Physician Assistants and Nurse Practitioners) who all work together to provide you with the care you need, when you need it.  Your next appointment:   1 year(s)  Provider:   Slater Duncan, NP    We recommend signing up for the patient portal called "MyChart".  Sign up information is provided on this After Visit Summary.  MyChart is used to connect with patients for Virtual Visits (Telemedicine).  Patients are able to view lab/test results, encounter notes, upcoming appointments, etc.  Non-urgent messages can be sent to your provider as well.   To learn more about what you can do with MyChart, go to [email or web address].   Other Instructions  Your physician wants you to follow-up in: 1 year.  You will receive a reminder letter in the mail two months in advance. If you don't receive a letter, please call our office to schedule the follow-up appointment.

## 2024-02-20 LAB — NMR, LIPOPROFILE
Cholesterol, Total: 117 mg/dL (ref 100–199)
HDL Particle Number: 44.4 umol/L (ref >=30.5)
HDL-C: 64 mg/dL (ref >39)
LDL Particle Number: <300 nmol/L (ref <1000)
LDL-C (NIH Calc): 38 mg/dL (ref 0–99)
LP-IR Score: 29 (ref <=45)
Small LDL Particle Number: <90 nmol/L (ref <=527)
Triglycerides: 71 mg/dL (ref 0–149)

## 2024-02-21 ENCOUNTER — Ambulatory Visit: Payer: Self-pay | Admitting: Nurse Practitioner

## 2024-04-03 ENCOUNTER — Encounter: Payer: Self-pay | Admitting: *Deleted

## 2024-04-03 DIAGNOSIS — Z006 Encounter for examination for normal comparison and control in clinical research program: Secondary | ICD-10-CM

## 2024-04-03 NOTE — Research (Signed)
 Spoke with Daniel Miranda about [employer] study. He would like more info about the study. Emailed a copy of the consents for him to read over. Encouraged to call or email me with any questions.

## 2024-04-09 DIAGNOSIS — L570 Actinic keratosis: Secondary | ICD-10-CM | POA: Diagnosis not present

## 2024-05-02 DIAGNOSIS — H35372 Puckering of macula, left eye: Secondary | ICD-10-CM | POA: Diagnosis not present

## 2024-05-02 DIAGNOSIS — H524 Presbyopia: Secondary | ICD-10-CM | POA: Diagnosis not present
